# Patient Record
Sex: Female | Born: 1990 | Race: Black or African American | Hispanic: No | Smoking: Former smoker
Health system: Southern US, Community
[De-identification: ages and names within clinical notes are randomized; demographics above are authoritative.]

## PROBLEM LIST (undated history)

## (undated) ENCOUNTER — Inpatient Hospital Stay (HOSPITAL_COMMUNITY): Payer: Self-pay

## (undated) DIAGNOSIS — N390 Urinary tract infection, site not specified: Secondary | ICD-10-CM

## (undated) DIAGNOSIS — R87629 Unspecified abnormal cytological findings in specimens from vagina: Secondary | ICD-10-CM

## (undated) DIAGNOSIS — R51 Headache: Secondary | ICD-10-CM

---
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
## 2009-02-11 ENCOUNTER — Other Ambulatory Visit: Admission: RE | Admit: 2009-02-11 | Discharge: 2009-02-11 | Payer: Self-pay | Admitting: Family Medicine

## 2009-03-02 ENCOUNTER — Emergency Department (HOSPITAL_COMMUNITY): Admission: EM | Admit: 2009-03-02 | Discharge: 2009-03-02 | Payer: Self-pay | Admitting: Emergency Medicine

## 2009-10-17 ENCOUNTER — Emergency Department (HOSPITAL_COMMUNITY): Admission: EM | Admit: 2009-10-17 | Discharge: 2009-10-17 | Payer: Self-pay | Admitting: Emergency Medicine

## 2010-02-07 ENCOUNTER — Emergency Department (HOSPITAL_COMMUNITY)
Admission: EM | Admit: 2010-02-07 | Discharge: 2010-02-07 | Disposition: A | Discharge status: Other Acute Inpatient Hospital | Payer: Self-pay | Source: Home / Self Care | Admitting: Family Medicine

## 2010-02-07 ENCOUNTER — Emergency Department (HOSPITAL_COMMUNITY)
Admission: EM | Admit: 2010-02-07 | Discharge: 2010-02-07 | Payer: Self-pay | Source: Home / Self Care | Admitting: Emergency Medicine

## 2010-04-21 LAB — COMPREHENSIVE METABOLIC PANEL
ALT: 20 U/L (ref 0–35)
AST: 21 U/L (ref 0–37)
Albumin: 3.6 g/dL (ref 3.5–5.2)
Alkaline Phosphatase: 73 U/L (ref 39–117)
BUN: 4 mg/dL — ABNORMAL LOW (ref 6–23)
CO2: 23 mEq/L (ref 19–32)
Calcium: 9 mg/dL (ref 8.4–10.5)
Chloride: 108 mEq/L (ref 96–112)
Creatinine, Ser: 0.8 mg/dL (ref 0.4–1.2)
GFR calc Af Amer: >60 mL/min (ref >60)
GFR calc non Af Amer: >60 mL/min (ref >60)
Glucose, Bld: 95 mg/dL (ref 70–99)
Potassium: 3.8 mEq/L (ref 3.5–5.1)
Sodium: 137 mEq/L (ref 135–145)
Total Bilirubin: 0.2 mg/dL — ABNORMAL LOW (ref 0.3–1.2)
Total Protein: 7.1 g/dL (ref 6.0–8.3)

## 2010-04-21 LAB — POCT URINALYSIS DIPSTICK
Glucose, UA: NEGATIVE mg/dL
Hgb urine dipstick: NEGATIVE
Ketones, ur: NEGATIVE mg/dL
Nitrite: NEGATIVE
Protein, ur: 30 mg/dL — AB
Specific Gravity, Urine: >=1.03 (ref 1.005–1.030)
Urobilinogen, UA: 0.2 mg/dL (ref 0.0–1.0)
pH: 5.5 (ref 5.0–8.0)

## 2010-04-21 LAB — WET PREP, GENITAL
Trich, Wet Prep: NONE SEEN
Yeast Wet Prep HPF POC: NONE SEEN

## 2010-04-21 LAB — CBC
HCT: 38.8 % (ref 36.0–46.0)
Hemoglobin: 12.8 g/dL (ref 12.0–15.0)
MCH: 29.7 pg (ref 26.0–34.0)
MCHC: 33 g/dL (ref 30.0–36.0)
MCV: 90 fL (ref 78.0–100.0)
Platelets: 277 10*3/uL (ref 150–400)
RBC: 4.31 MIL/uL (ref 3.87–5.11)
RDW: 12.7 % (ref 11.5–15.5)
WBC: 7.6 10*3/uL (ref 4.0–10.5)

## 2010-04-21 LAB — RPR: RPR Ser Ql: NONREACTIVE

## 2010-04-21 LAB — POCT PREGNANCY, URINE: Preg Test, Ur: NEGATIVE

## 2010-04-21 LAB — GC/CHLAMYDIA PROBE AMP, GENITAL
Chlamydia, DNA Probe: NEGATIVE
GC Probe Amp, Genital: NEGATIVE

## 2010-04-21 LAB — LIPASE, BLOOD: Lipase: 109 U/L — ABNORMAL HIGH (ref 11–59)

## 2010-04-24 LAB — POCT URINALYSIS DIPSTICK
Bilirubin Urine: NEGATIVE
Glucose, UA: NEGATIVE mg/dL
Ketones, ur: NEGATIVE mg/dL
Nitrite: NEGATIVE
Protein, ur: 30 mg/dL — AB
Specific Gravity, Urine: 1.01 (ref 1.005–1.030)
Urobilinogen, UA: 0.2 mg/dL (ref 0.0–1.0)
pH: 7.5 (ref 5.0–8.0)

## 2010-04-24 LAB — URINE CULTURE
Colony Count: 55000
Culture  Setup Time: 201109081751

## 2010-04-24 LAB — POCT PREGNANCY, URINE: Preg Test, Ur: NEGATIVE

## 2010-05-29 ENCOUNTER — Emergency Department (HOSPITAL_COMMUNITY): Payer: 59

## 2010-05-29 ENCOUNTER — Emergency Department (HOSPITAL_COMMUNITY)
Admission: EM | Admit: 2010-05-29 | Discharge: 2010-05-29 | Disposition: A | Discharge status: Home / Self Care | Payer: 59 | Attending: Emergency Medicine | Admitting: Emergency Medicine

## 2010-05-29 DIAGNOSIS — F17200 Nicotine dependence, unspecified, uncomplicated: Secondary | ICD-10-CM | POA: Insufficient documentation

## 2010-05-29 DIAGNOSIS — IMO0001 Reserved for inherently not codable concepts without codable children: Secondary | ICD-10-CM | POA: Insufficient documentation

## 2010-05-29 DIAGNOSIS — J3489 Other specified disorders of nose and nasal sinuses: Secondary | ICD-10-CM | POA: Insufficient documentation

## 2010-05-29 DIAGNOSIS — J040 Acute laryngitis: Secondary | ICD-10-CM | POA: Insufficient documentation

## 2010-05-29 DIAGNOSIS — R059 Cough, unspecified: Secondary | ICD-10-CM | POA: Insufficient documentation

## 2010-05-29 DIAGNOSIS — R6883 Chills (without fever): Secondary | ICD-10-CM | POA: Insufficient documentation

## 2010-05-29 DIAGNOSIS — J40 Bronchitis, not specified as acute or chronic: Secondary | ICD-10-CM | POA: Insufficient documentation

## 2010-05-29 DIAGNOSIS — R05 Cough: Secondary | ICD-10-CM | POA: Insufficient documentation

## 2010-05-29 DIAGNOSIS — J029 Acute pharyngitis, unspecified: Secondary | ICD-10-CM | POA: Insufficient documentation

## 2010-05-29 LAB — RAPID STREP SCREEN (MED CTR MEBANE ONLY): Streptococcus, Group A Screen (Direct): NEGATIVE

## 2010-07-28 ENCOUNTER — Emergency Department (HOSPITAL_COMMUNITY)
Admission: EM | Admit: 2010-07-28 | Discharge: 2010-07-28 | Disposition: A | Discharge status: Home / Self Care | Payer: 59 | Attending: Emergency Medicine | Admitting: Emergency Medicine

## 2010-07-28 DIAGNOSIS — R300 Dysuria: Secondary | ICD-10-CM | POA: Insufficient documentation

## 2010-07-28 DIAGNOSIS — N12 Tubulo-interstitial nephritis, not specified as acute or chronic: Secondary | ICD-10-CM | POA: Insufficient documentation

## 2010-07-28 DIAGNOSIS — N898 Other specified noninflammatory disorders of vagina: Secondary | ICD-10-CM | POA: Insufficient documentation

## 2010-07-28 DIAGNOSIS — R1110 Vomiting, unspecified: Secondary | ICD-10-CM | POA: Insufficient documentation

## 2010-07-28 DIAGNOSIS — R197 Diarrhea, unspecified: Secondary | ICD-10-CM | POA: Insufficient documentation

## 2010-07-28 DIAGNOSIS — R350 Frequency of micturition: Secondary | ICD-10-CM | POA: Insufficient documentation

## 2010-07-28 DIAGNOSIS — R109 Unspecified abdominal pain: Secondary | ICD-10-CM | POA: Insufficient documentation

## 2010-07-28 LAB — URINALYSIS, ROUTINE W REFLEX MICROSCOPIC
Bilirubin Urine: NEGATIVE
Glucose, UA: NEGATIVE mg/dL
Hgb urine dipstick: NEGATIVE
Ketones, ur: NEGATIVE mg/dL
Nitrite: NEGATIVE
Protein, ur: NEGATIVE mg/dL
Specific Gravity, Urine: 1.014 (ref 1.005–1.030)
Urobilinogen, UA: 0.2 mg/dL (ref 0.0–1.0)
pH: 6.5 (ref 5.0–8.0)

## 2010-07-28 LAB — DIFFERENTIAL
Basophils Absolute: 0 10*3/uL (ref 0.0–0.1)
Basophils Relative: 0 % (ref 0–1)
Eosinophils Absolute: 0.2 10*3/uL (ref 0.0–0.7)
Eosinophils Relative: 3 % (ref 0–5)
Lymphocytes Relative: 20 % (ref 12–46)
Lymphs Abs: 1.4 10*3/uL (ref 0.7–4.0)
Monocytes Absolute: 0.6 10*3/uL (ref 0.1–1.0)
Monocytes Relative: 8 % (ref 3–12)
Neutro Abs: 4.8 10*3/uL (ref 1.7–7.7)
Neutrophils Relative %: 68 % (ref 43–77)

## 2010-07-28 LAB — CBC
HCT: 37.8 % (ref 36.0–46.0)
Hemoglobin: 13.2 g/dL (ref 12.0–15.0)
MCH: 30.8 pg (ref 26.0–34.0)
MCHC: 34.9 g/dL (ref 30.0–36.0)
MCV: 88.3 fL (ref 78.0–100.0)
Platelets: 297 10*3/uL (ref 150–400)
RBC: 4.28 MIL/uL (ref 3.87–5.11)
RDW: 12.3 % (ref 11.5–15.5)
WBC: 7.1 10*3/uL (ref 4.0–10.5)

## 2010-07-28 LAB — COMPREHENSIVE METABOLIC PANEL
ALT: 15 U/L (ref 0–35)
AST: 15 U/L (ref 0–37)
Albumin: 3.4 g/dL — ABNORMAL LOW (ref 3.5–5.2)
Alkaline Phosphatase: 71 U/L (ref 39–117)
BUN: 5 mg/dL — ABNORMAL LOW (ref 6–23)
CO2: 27 mEq/L (ref 19–32)
Calcium: 9.3 mg/dL (ref 8.4–10.5)
Chloride: 99 mEq/L (ref 96–112)
Creatinine, Ser: 0.84 mg/dL (ref 0.50–1.10)
GFR calc Af Amer: >60 mL/min (ref >60)
GFR calc non Af Amer: >60 mL/min (ref >60)
Glucose, Bld: 90 mg/dL (ref 70–99)
Potassium: 3.6 mEq/L (ref 3.5–5.1)
Sodium: 135 mEq/L (ref 135–145)
Total Bilirubin: 0.3 mg/dL (ref 0.3–1.2)
Total Protein: 6.9 g/dL (ref 6.0–8.3)

## 2010-07-28 LAB — URINE MICROSCOPIC-ADD ON

## 2010-07-28 LAB — WET PREP, GENITAL
Trich, Wet Prep: NONE SEEN
Yeast Wet Prep HPF POC: NONE SEEN

## 2010-07-28 LAB — LIPASE, BLOOD: Lipase: 16 U/L (ref 11–59)

## 2010-07-28 LAB — POCT PREGNANCY, URINE: Preg Test, Ur: NEGATIVE

## 2010-07-30 LAB — URINE CULTURE
Colony Count: >=100000
Culture  Setup Time: 201206182050

## 2010-07-30 LAB — GC/CHLAMYDIA PROBE AMP, GENITAL
Chlamydia, DNA Probe: NEGATIVE
GC Probe Amp, Genital: NEGATIVE

## 2010-10-17 ENCOUNTER — Emergency Department (HOSPITAL_COMMUNITY): Payer: 59

## 2010-10-17 ENCOUNTER — Emergency Department (HOSPITAL_COMMUNITY)
Admission: EM | Admit: 2010-10-17 | Discharge: 2010-10-17 | Disposition: A | Discharge status: Home / Self Care | Payer: 59 | Attending: Emergency Medicine | Admitting: Emergency Medicine

## 2010-10-17 DIAGNOSIS — R109 Unspecified abdominal pain: Secondary | ICD-10-CM | POA: Insufficient documentation

## 2010-10-17 DIAGNOSIS — F17200 Nicotine dependence, unspecified, uncomplicated: Secondary | ICD-10-CM | POA: Insufficient documentation

## 2010-10-17 DIAGNOSIS — R10819 Abdominal tenderness, unspecified site: Secondary | ICD-10-CM | POA: Insufficient documentation

## 2010-10-17 DIAGNOSIS — O99891 Other specified diseases and conditions complicating pregnancy: Secondary | ICD-10-CM | POA: Insufficient documentation

## 2010-10-17 LAB — URINALYSIS, ROUTINE W REFLEX MICROSCOPIC
Bilirubin Urine: NEGATIVE
Glucose, UA: NEGATIVE mg/dL
Hgb urine dipstick: NEGATIVE
Ketones, ur: 15 mg/dL — AB
Leukocytes, UA: NEGATIVE
Nitrite: NEGATIVE
Protein, ur: NEGATIVE mg/dL
Specific Gravity, Urine: 1.013 (ref 1.005–1.030)
Urobilinogen, UA: 0.2 mg/dL (ref 0.0–1.0)
pH: 8.5 — ABNORMAL HIGH (ref 5.0–8.0)

## 2010-10-17 LAB — POCT I-STAT, CHEM 8
BUN: <3 mg/dL — ABNORMAL LOW (ref 6–23)
Calcium, Ion: 1.28 mmol/L (ref 1.12–1.32)
Chloride: 103 mEq/L (ref 96–112)
Creatinine, Ser: 0.7 mg/dL (ref 0.50–1.10)
Glucose, Bld: 81 mg/dL (ref 70–99)
HCT: 36 % (ref 36.0–46.0)
Hemoglobin: 12.2 g/dL (ref 12.0–15.0)
Potassium: 3.5 mEq/L (ref 3.5–5.1)
Sodium: 138 mEq/L (ref 135–145)
TCO2: 22 mmol/L (ref 0–100)

## 2010-10-17 LAB — DIFFERENTIAL
Basophils Absolute: 0 10*3/uL (ref 0.0–0.1)
Basophils Relative: 0 % (ref 0–1)
Eosinophils Absolute: 0.3 10*3/uL (ref 0.0–0.7)
Eosinophils Relative: 5 % (ref 0–5)
Lymphocytes Relative: 36 % (ref 12–46)
Lymphs Abs: 2.3 10*3/uL (ref 0.7–4.0)
Monocytes Absolute: 0.4 10*3/uL (ref 0.1–1.0)
Monocytes Relative: 6 % (ref 3–12)
Neutro Abs: 3.4 10*3/uL (ref 1.7–7.7)
Neutrophils Relative %: 54 % (ref 43–77)

## 2010-10-17 LAB — RETICULOCYTES
RBC.: 4.04 MIL/uL (ref 3.87–5.11)
Retic Count, Absolute: 48.5 10*3/uL (ref 19.0–186.0)
Retic Ct Pct: 1.2 % (ref 0.4–3.1)

## 2010-10-17 LAB — CBC
HCT: 35 % — ABNORMAL LOW (ref 36.0–46.0)
Hemoglobin: 12.5 g/dL (ref 12.0–15.0)
MCH: 30.9 pg (ref 26.0–34.0)
MCHC: 35.7 g/dL (ref 30.0–36.0)
MCV: 86.6 fL (ref 78.0–100.0)
Platelets: 259 10*3/uL (ref 150–400)
RBC: 4.04 MIL/uL (ref 3.87–5.11)
RDW: 12.7 % (ref 11.5–15.5)
WBC: 6.4 10*3/uL (ref 4.0–10.5)

## 2010-10-17 LAB — ABO/RH: ABO/RH(D): B POS

## 2010-10-17 LAB — POCT PREGNANCY, URINE: Preg Test, Ur: POSITIVE

## 2010-10-17 LAB — HCG, QUANTITATIVE, PREGNANCY: hCG, Beta Chain, Quant, S: 38462 m[IU]/mL — ABNORMAL HIGH (ref <5)

## 2010-10-18 LAB — GC/CHLAMYDIA PROBE AMP, GENITAL
Chlamydia, DNA Probe: NEGATIVE
GC Probe Amp, Genital: NEGATIVE

## 2010-11-02 ENCOUNTER — Encounter (HOSPITAL_COMMUNITY): Payer: Self-pay

## 2010-11-02 ENCOUNTER — Inpatient Hospital Stay (HOSPITAL_COMMUNITY)
Admission: AD | Admit: 2010-11-02 | Discharge: 2010-11-02 | Disposition: A | Discharge status: Home / Self Care | Payer: 59 | Source: Ambulatory Visit | Attending: Obstetrics and Gynecology | Admitting: Obstetrics and Gynecology

## 2010-11-02 DIAGNOSIS — O99891 Other specified diseases and conditions complicating pregnancy: Secondary | ICD-10-CM | POA: Insufficient documentation

## 2010-11-02 DIAGNOSIS — J069 Acute upper respiratory infection, unspecified: Secondary | ICD-10-CM | POA: Insufficient documentation

## 2010-11-02 DIAGNOSIS — J40 Bronchitis, not specified as acute or chronic: Secondary | ICD-10-CM | POA: Insufficient documentation

## 2010-11-02 DIAGNOSIS — J209 Acute bronchitis, unspecified: Secondary | ICD-10-CM

## 2010-11-02 LAB — URINALYSIS, ROUTINE W REFLEX MICROSCOPIC
Bilirubin Urine: NEGATIVE
Glucose, UA: NEGATIVE mg/dL
Hgb urine dipstick: NEGATIVE
Ketones, ur: NEGATIVE mg/dL
Leukocytes, UA: NEGATIVE
Nitrite: NEGATIVE
Protein, ur: NEGATIVE mg/dL
Specific Gravity, Urine: 1.015 (ref 1.005–1.030)
Urobilinogen, UA: 0.2 mg/dL (ref 0.0–1.0)
pH: 7 (ref 5.0–8.0)

## 2010-11-02 MED ORDER — AZITHROMYCIN 1 G PO PACK: 1.0000 | PACK | Freq: Once | ORAL | Status: AC

## 2010-11-02 MED ORDER — GUAIFENESIN-DM 100-10 MG/5ML PO SYRP: 5.0000 mL | ORAL_SOLUTION | Freq: Three times a day (TID) | ORAL | Status: DC | PRN

## 2010-11-02 MED ORDER — GUAIFENESIN-CODEINE 100-10 MG/5ML PO SYRP: 5.0000 mL | ORAL_SOLUTION | Freq: Three times a day (TID) | ORAL | Status: AC | PRN

## 2010-11-02 NOTE — Progress Notes (Signed)
Pt states she has had a cough and nasal congestion for 3 days-states she also has chest pains in the under her breast-states she did not have this until today-feels like it is related to coughing so much

## 2010-11-02 NOTE — ED Provider Notes (Signed)
Carloyn Manner [id number] Chief Complaint  Patient presents with  . Chest Pain    SUBJECTIVE  HPI: 3 d hx of UR congestion and progressive cough, at times productive of mucus. Today having substernal CP with coughing and with deep inspiration. Denies wheezing, SOB, fever/chills. Denies allergies or smoking exposure.Had been on inhaler years ago.  Has urinary frequency, no dysuria.   No past medical history on file. No past surgical history on file. History   Social History  . Marital Status: Single    Spouse Name: N/A    Number of Children: N/A  . Years of Education: N/A   Occupational History  . Not on file.   Social History Main Topics  . Smoking status: Not on file  . Smokeless tobacco: Not on file  . Alcohol Use: Not on file  . Drug Use: Not on file  . Sexually Active: Not on file   Other Topics Concern  . Not on file   Social History Narrative  . No narrative on file   No current facility-administered medications on file prior to encounter.   No current outpatient prescriptions on file prior to encounter.   No Known Allergies  ROS: pertinent items in HPI  OBJECTIVE  BP 116/65  Pulse 88  Temp(Src) 98.7 F (37.1 C) (Oral)  Resp 18  Ht 5\' 7"  (1.702 m)  Wt 91.354 kg (201 lb 6.4 oz)  BMI 31.54 kg/m2  FHR DT 155  Physical Exam:  General: WN/WD in NAD Chest wall: nontender to palpation or compression Lungs: CTA bilat Cor: RRR w/o m Abd: soft, NT, S=D  Results for orders placed during the hospital encounter of 11/02/10 (from the past 24 hour(s))  URINALYSIS, ROUTINE W REFLEX MICROSCOPIC     Status: Normal   Collection Time   11/02/10  7:20 PM      Component Value Range   Color, Urine YELLOW  YELLOW    Appearance CLEAR  CLEAR    Specific Gravity, Urine 1.015  1.005 - 1.030    pH 7.0  5.0 - 8.0    Glucose, UA NEGATIVE  NEGATIVE (mg/dL)   Hgb urine dipstick NEGATIVE  NEGATIVE    Bilirubin Urine NEGATIVE  NEGATIVE    Ketones, ur NEGATIVE   NEGATIVE (mg/dL)   Protein, ur NEGATIVE  NEGATIVE (mg/dL)   Urobilinogen, UA 0.2  0.0 - 1.0 (mg/dL)   Nitrite NEGATIVE  NEGATIVE    Leukocytes, UA NEGATIVE  NEGATIVE     ASSESSMENT  URI/bronchitis IUP 14.4 wks   PLAN C/w Dr. Henderson Cloud: Z-pak and Tussinex Rx. Call office if fever, worsening sx. General measures reviewed

## 2010-11-02 NOTE — Progress Notes (Signed)
Patient reports has a cold with chest pains, inhaler has expired, onset 3 days ago.

## 2011-03-29 ENCOUNTER — Encounter (HOSPITAL_COMMUNITY): Payer: Self-pay | Admitting: *Deleted

## 2011-03-29 ENCOUNTER — Inpatient Hospital Stay (HOSPITAL_COMMUNITY)
Admission: AD | Admit: 2011-03-29 | Discharge: 2011-03-30 | Disposition: A | Discharge status: Home / Self Care | Payer: 59 | Source: Ambulatory Visit | Attending: Obstetrics & Gynecology | Admitting: Obstetrics & Gynecology

## 2011-03-29 DIAGNOSIS — R109 Unspecified abdominal pain: Secondary | ICD-10-CM | POA: Insufficient documentation

## 2011-03-29 DIAGNOSIS — O99891 Other specified diseases and conditions complicating pregnancy: Secondary | ICD-10-CM | POA: Insufficient documentation

## 2011-03-29 DIAGNOSIS — M6208 Separation of muscle (nontraumatic), other site: Secondary | ICD-10-CM

## 2011-03-29 DIAGNOSIS — M6200 Separation of muscle (nontraumatic), unspecified site: Secondary | ICD-10-CM | POA: Insufficient documentation

## 2011-03-29 HISTORY — DX: Headache: R51

## 2011-03-29 NOTE — Progress Notes (Signed)
Pt c/o of burining across her lower abd and also at the belly button-also c/o of feeling SOB

## 2011-03-29 NOTE — Progress Notes (Signed)
Pt reports a "burning sensation just below" umbilicus since about 11:00 am. Pt also states that she has had difficulty breathing since Friday. It hurts under her L breast when she take a deep breath. Pt also reports that breathing is more difficult when lying down.

## 2011-03-30 ENCOUNTER — Encounter (HOSPITAL_COMMUNITY): Payer: Self-pay | Admitting: *Deleted

## 2011-03-30 MED ORDER — ACETAMINOPHEN 325 MG PO TABS
650.0000 mg | ORAL_TABLET | Freq: Once | ORAL | Status: AC
  Administered 2011-03-30: 650 mg via ORAL
  Filled 2011-03-30: qty 2

## 2011-03-30 NOTE — Discharge Instructions (Signed)
* Return to your provider in [place] (or [facility] hospital, if still in town) if you think you are in labor, your water breaks, you have vaginal bleeding, your baby isn't moving like normal, if your symptoms get worse, or for any other concerns.*  Pregnancy - Third Trimester The third trimester of pregnancy (the last 3 months) is a period of the most rapid growth for you and your baby. The baby approaches a length of 20 inches and a weight of 6 to 10 pounds. The baby is adding on fat and getting ready for life outside your body. While inside, babies have periods of sleeping and waking, suck their thumbs, and hiccups. You can often feel small contractions of the uterus. This is false labor. It is also called Braxton-Hicks contractions. This is like a practice for labor. The usual problems in this stage of pregnancy include more difficulty breathing, swelling of the hands and feet from water retention, and having to urinate more often because of the uterus and baby pressing on your bladder.  PRENATAL EXAMS  Blood work may continue to be done during prenatal exams. These tests are done to check on your health and the probable health of your baby. Blood work is used to follow your blood levels (hemoglobin). Anemia (low hemoglobin) is common during pregnancy. Iron and vitamins are given to help prevent this. You may also continue to be checked for diabetes. Some of the past blood tests may be done again.   The size of the uterus is measured during each visit. This makes sure your baby is growing properly according to your pregnancy dates.   Your blood pressure is checked every prenatal visit. This is to make sure you are not getting toxemia.   Your urine is checked every prenatal visit for infection, diabetes and protein.   Your weight is checked at each visit. This is done to make sure gains are happening at the suggested rate and that you and your baby are growing normally.   Sometimes, an ultrasound is  performed to confirm the position and the proper growth and development of the baby. This is a test done that bounces harmless sound waves off the baby so your caregiver can more accurately determine due dates.   Discuss the type of pain medication and anesthesia you will have during your labor and delivery.   Discuss the possibility and anesthesia if a Cesarean Section might be necessary.   Inform your caregiver if there is any mental or physical violence at home.  Sometimes, a specialized non-stress test, contraction stress test and biophysical profile are done to make sure the baby is not having a problem. Checking the amniotic fluid surrounding the baby is called an amniocentesis. The amniotic fluid is removed by sticking a needle into the belly (abdomen). This is sometimes done near the end of pregnancy if an early delivery is required. In this case, it is done to help make sure the baby's lungs are mature enough for the baby to live outside of the womb. If the lungs are not mature and it is unsafe to deliver the baby, an injection of cortisone medication is given to the mother 1 to 2 days before the delivery. This helps the baby's lungs mature and makes it safer to deliver the baby. CHANGES OCCURING IN THE THIRD TRIMESTER OF PREGNANCY Your body goes through many changes during pregnancy. They vary from person to person. Talk to your caregiver about changes you notice and are concerned about.  During the last trimester, you have probably had an increase in your appetite. It is normal to have cravings for certain foods. This varies from person to person and pregnancy to pregnancy.   You may begin to get stretch marks on your hips, abdomen, and breasts. These are normal changes in the body during pregnancy. There are no exercises or medications to take which prevent this change.   Constipation may be treated with a stool softener or adding bulk to your diet. Drinking lots of fluids, fiber in  vegetables, fruits, and whole grains are helpful.   Exercising is also helpful. If you have been very active up until your pregnancy, most of these activities can be continued during your pregnancy. If you have been less active, it is helpful to start an exercise program such as walking. Consult your caregiver before starting exercise programs.   Avoid all smoking, alcohol, un-prescribed drugs, herbs and "street drugs" during your pregnancy. These chemicals affect the formation and growth of the baby. Avoid chemicals throughout the pregnancy to ensure the delivery of a healthy infant.   Backache, varicose veins and hemorrhoids may develop or get worse.   You will tire more easily in the third trimester, which is normal.   The baby's movements may be stronger and more often.   You may become short of breath easily.   Your belly button may stick out.   A yellow discharge may leak from your breasts called colostrum.   You may have a bloody mucus discharge. This usually occurs a few days to a week before labor begins.  HOME CARE INSTRUCTIONS   Keep your caregiver's appointments. Follow your caregiver's instructions regarding medication use, exercise, and diet.   During pregnancy, you are providing food for you and your baby. Continue to eat regular, well-balanced meals. Choose foods such as meat, fish, milk and other low fat dairy products, vegetables, fruits, and whole-grain breads and cereals. Your caregiver will tell you of the ideal weight gain.   A physical sexual relationship may be continued throughout pregnancy if there are no other problems such as early (premature) leaking of amniotic fluid from the membranes, vaginal bleeding, or belly (abdominal) pain.   Exercise regularly if there are no restrictions. Check with your caregiver if you are unsure of the safety of your exercises. Greater weight gain will occur in the last 2 trimesters of pregnancy. Exercising helps:   Control your  weight.   Get you in shape for labor and delivery.   You lose weight after you deliver.   Rest a lot with legs elevated, or as needed for leg cramps or low back pain.   Wear a good support or jogging bra for breast tenderness during pregnancy. This may help if worn during sleep. Pads or tissues may be used in the bra if you are leaking colostrum.   Do not use hot tubs, steam rooms, or saunas.   Wear your seat belt when driving. This protects you and your baby if you are in an accident.   Avoid raw meat, cat litter boxes and soil used by cats. These carry germs that can cause birth defects in the baby.   It is easier to loose urine during pregnancy. Tightening up and strengthening the pelvic muscles will help with this problem. You can practice stopping your urination while you are going to the bathroom. These are the same muscles you need to strengthen. It is also the muscles you would use if you were  trying to stop from passing gas. You can practice tightening these muscles up 10 times a set and repeating this about 3 times per day. Once you know what muscles to tighten up, do not perform these exercises during urination. It is more likely to cause an infection by backing up the urine.   Ask for help if you have financial, counseling or nutritional needs during pregnancy. Your caregiver will be able to offer counseling for these needs as well as refer you for other special needs.   Make a list of emergency phone numbers and have them available.   Plan on getting help from family or friends when you go home from the hospital.   Make a trial run to the hospital.   Take prenatal classes with the father to understand, practice and ask questions about the labor and delivery.   Prepare the baby's room/nursery.   Do not travel out of the city unless it is absolutely necessary and with the advice of your caregiver.   Wear only low or no heal shoes to have better balance and prevent falling.    MEDICATIONS AND DRUG USE IN PREGNANCY  Take prenatal vitamins as directed. The vitamin should contain 1 milligram of folic acid. Keep all vitamins out of reach of children. Only a couple vitamins or tablets containing iron may be fatal to a baby or young child when ingested.   Avoid use of all medications, including herbs, over-the-counter medications, not prescribed or suggested by your caregiver. Only take over-the-counter or prescription medicines for pain, discomfort, or fever as directed by your caregiver. Do not use aspirin, ibuprofen (Motrin, Advil, Nuprin) or naproxen (Aleve) unless OK'd by your caregiver.   Let your caregiver also know about herbs you may be using.   Alcohol is related to a number of birth defects. This includes fetal alcohol syndrome. All alcohol, in any form, should be avoided completely. Smoking will cause low birth rate and premature babies.   Street/illegal drugs are very harmful to the baby. They are absolutely forbidden. A baby born to an addicted mother will be addicted at birth. The baby will go through the same withdrawal an adult does.  SEEK MEDICAL CARE IF: You have any concerns or worries during your pregnancy. It is better to call with your questions if you feel they cannot wait, rather than worry about them. DECISIONS ABOUT CIRCUMCISION You may or may not know the sex of your baby. If you know your baby is a boy, it may be time to think about circumcision. Circumcision is the removal of the foreskin of the penis. This is the skin that covers the sensitive end of the penis. There is no proven medical need for this. Often this decision is made on what is popular at the time or based upon religious beliefs and social issues. You can discuss these issues with your caregiver or pediatrician. SEEK IMMEDIATE MEDICAL CARE IF:   An unexplained oral temperature above 102 F (38.9 C) develops, or as your caregiver suggests.   You have leaking of fluid from the  vagina (birth canal). If leaking membranes are suspected, take your temperature and tell your caregiver of this when you call.   There is vaginal spotting, bleeding or passing clots. Tell your caregiver of the amount and how many pads are used.   You develop a bad smelling vaginal discharge with a change in the color from clear to white.   You develop vomiting that lasts more than 24  hours.   You develop chills or fever.   You develop shortness of breath.   You develop burning on urination.   You loose more than 2 pounds of weight or gain more than 2 pounds of weight or as suggested by your caregiver.   You notice sudden swelling of your face, hands, and feet or legs.   You develop belly (abdominal) pain. Round ligament discomfort is a common non-cancerous (benign) cause of abdominal pain in pregnancy. Your caregiver still must evaluate you.   You develop a severe headache that does not go away.   You develop visual problems, blurred or double vision.   If you have not felt your baby move for more than 1 hour. If you think the baby is not moving as much as usual, eat something with sugar in it and lie down on your left side for an hour. The baby should move at least 4 to 5 times per hour. Call right away if your baby moves less than that.   You fall, are in a car accident or any kind of trauma.   There is mental or physical violence at home.  Document Released: 01/20/2001 Document Revised: 10/08/2010 Document Reviewed: 07/25/2008 [facility] Patient Information 2012 [place], [place].  Fetal Movement Counts Patient Name: __________________________________________________ Patient Due Date: ____________________ Annette Conway counts is highly recommended in high risk pregnancies, but it is a good idea for every pregnant woman to do. Start counting fetal movements at 28 weeks of the pregnancy. Fetal movements increase after eating a full meal or eating or drinking something sweet (the blood sugar is  higher). It is also important to drink plenty of fluids (well hydrated) before doing the count. Lie on your left side because it helps with the circulation or you can sit in a comfortable chair with your arms over your belly (abdomen) with no distractions around you. DOING THE COUNT  Try to do the count the same time of day each time you do it.   Mark the day and time, then see how long it takes for you to feel 10 movements (kicks, flutters, swishes, rolls). You should have at least 10 movements within 2 hours. You will most likely feel 10 movements in much less than 2 hours. If you do not, wait an hour and count again. After a couple of days you will see a pattern.   What you are looking for is a change in the pattern or not enough counts in 2 hours. Is it taking longer in time to reach 10 movements?  SEEK MEDICAL CARE IF:  You feel less than 10 counts in 2 hours. Tried twice.   No movement in one hour.   The pattern is changing or taking longer each day to reach 10 counts in 2 hours.   You feel the baby is not moving as it usually does.  Date: ____________ Movements: ____________ Start time: ____________ Annette Conway time: ____________  Date: ____________ Movements: ____________ Start time: ____________ Annette Conway time: ____________ Date: ____________ Movements: ____________ Start time: ____________ Annette Conway time: ____________ Date: ____________ Movements: ____________ Start time: ____________ Annette Conway time: ____________ Date: ____________ Movements: ____________ Start time: ____________ Annette Conway time: ____________ Date: ____________ Movements: ____________ Start time: ____________ Annette Conway time: ____________ Date: ____________ Movements: ____________ Start time: ____________ Annette Conway time: ____________ Date: ____________ Movements: ____________ Start time: ____________ Annette Conway time: ____________  Date: ____________ Movements: ____________ Start time: ____________ Annette Conway time: ____________ Date: ____________  Movements: ____________ Start time: ____________ Annette Conway time: ____________ Date: ____________ Movements: ____________  Start time: ____________ Annette Conway time: ____________ Date: ____________ Movements: ____________ Start time: ____________ Annette Conway time: ____________ Date: ____________ Movements: ____________ Start time: ____________ Annette Conway time: ____________ Date: ____________ Movements: ____________ Start time: ____________ Annette Conway time: ____________ Date: ____________ Movements: ____________ Start time: ____________ Annette Conway time: ____________  Date: ____________ Movements: ____________ Start time: ____________ Annette Conway time: ____________ Date: ____________ Movements: ____________ Start time: ____________ Annette Conway time: ____________ Date: ____________ Movements: ____________ Start time: ____________ Annette Conway time: ____________ Date: ____________ Movements: ____________ Start time: ____________ Annette Conway time: ____________ Date: ____________ Movements: ____________ Start time: ____________ Annette Conway time: ____________ Date: ____________ Movements: ____________ Start time: ____________ Annette Conway time: ____________ Date: ____________ Movements: ____________ Start time: ____________ Annette Conway time: ____________  Date: ____________ Movements: ____________ Start time: ____________ Annette Conway time: ____________ Date: ____________ Movements: ____________ Start time: ____________ Annette Conway time: ____________ Date: ____________ Movements: ____________ Start time: ____________ Annette Conway time: ____________ Date: ____________ Movements: ____________ Start time: ____________ Annette Conway time: ____________ Date: ____________ Movements: ____________ Start time: ____________ Annette Conway time: ____________ Date: ____________ Movements: ____________ Start time: ____________ Annette Conway time: ____________ Date: ____________ Movements: ____________ Start time: ____________ Annette Conway time: ____________  Date: ____________ Movements: ____________ Start time:  ____________ Annette Conway time: ____________ Date: ____________ Movements: ____________ Start time: ____________ Annette Conway time: ____________ Date: ____________ Movements: ____________ Start time: ____________ Annette Conway time: ____________ Date: ____________ Movements: ____________ Start time: ____________ Annette Conway time: ____________ Date: ____________ Movements: ____________ Start time: ____________ Annette Conway time: ____________ Date: ____________ Movements: ____________ Start time: ____________ Annette Conway time: ____________ Date: ____________ Movements: ____________ Start time: ____________ Annette Conway time: ____________  Date: ____________ Movements: ____________ Start time: ____________ Annette Conway time: ____________ Date: ____________ Movements: ____________ Start time: ____________ Annette Conway time: ____________ Date: ____________ Movements: ____________ Start time: ____________ Annette Conway time: ____________ Date: ____________ Movements: ____________ Start time: ____________ Annette Conway time: ____________ Date: ____________ Movements: ____________ Start time: ____________ Annette Conway time: ____________ Date: ____________ Movements: ____________ Start time: ____________ Annette Conway time: ____________ Date: ____________ Movements: ____________ Start time: ____________ Annette Conway time: ____________  Date: ____________ Movements: ____________ Start time: ____________ Annette Conway time: ____________ Date: ____________ Movements: ____________ Start time: ____________ Annette Conway time: ____________ Date: ____________ Movements: ____________ Start time: ____________ Annette Conway time: ____________ Date: ____________ Movements: ____________ Start time: ____________ Annette Conway time: ____________ Date: ____________ Movements: ____________ Start time: ____________ Annette Conway time: ____________ Date: ____________ Movements: ____________ Start time: ____________ Annette Conway time: ____________ Date: ____________ Movements: ____________ Start time: ____________ Annette Conway time: ____________  Date:  ____________ Movements: ____________ Start time: ____________ Annette Conway time: ____________ Date: ____________ Movements: ____________ Start time: ____________ Annette Conway time: ____________ Date: ____________ Movements: ____________ Start time: ____________ Annette Conway time: ____________ Date: ____________ Movements: ____________ Start time: ____________ Annette Conway time: ____________ Date: ____________ Movements: ____________ Start time: ____________ Annette Conway time: ____________ Date: ____________ Movements: ____________ Start time: ____________ Annette Conway time: ____________ Document Released: 02/25/2006 Document Revised: 10/08/2010 Document Reviewed: 08/28/2008 [employer] Patient Information 2012 [employer].

## 2011-03-30 NOTE — ED Provider Notes (Signed)
History   Annette Conway is a 21 y.o. G1P0 at [redacted]w[redacted]d presenting with report of constant burning sensation below umbilicus that began @ approximately 1100 on 2/17, as well as pain under Lt breast upon inspiration with intermittent sob.  Denies recent URI/cough or chest pain.  Also headache today, hasn't taken anything at home to relieve.  H/O migraines.  Denies scotomata or RUQ pain.  Reports +fm.  Denies VB or LOF.  Receives pnc in [place], is here visiting until approximately Wed.  Pregnancy has been uncomplicated per pt, last visit was on Fri 2/15 and 'all was fine', next visit is on Fri 2/22.    Chief Complaint  Patient presents with  . Abdominal Pain   HPI  OB History    Grav Para Term Preterm Abortions TAB SAB Ect Mult Living   1               Past Medical History  Diagnosis Date  . No pertinent past medical history   . Headache     Past Surgical History  Procedure Date  . No past surgeries     Family History  Problem Relation Age of Onset  . Anesthesia problems Neg Hx   . Hypotension Neg Hx   . Malignant hyperthermia Neg Hx   . Pseudochol deficiency Neg Hx     History  Substance Use Topics  . Smoking status: Former Smoker -- 0.2 packs/day for 4 years    Types: Cigarettes    Quit date: 02/26/2011  . Smokeless tobacco: Not on file  . Alcohol Use: No    Allergies: No Known Allergies  Prescriptions prior to admission  Medication Sig Dispense Refill  . prenatal vitamin w/FE, FA (PRENATAL 1 + 1) 27-1 MG TABS Take 1 tablet by mouth daily.          Review of Systems  Constitutional: Negative.   Eyes: Negative for blurred vision, double vision and photophobia.  Respiratory: Negative for cough. Shortness of breath: occasional.   Cardiovascular: Negative.   Gastrointestinal: Negative for abdominal pain.       Constant 'burning' under umbilicus all day Last BM yesterday  Genitourinary: Negative.   Musculoskeletal: Negative.   Skin: Negative.   Neurological:  Negative.   Endo/Heme/Allergies: Negative.   Psychiatric/Behavioral: Negative.    Physical Exam   Blood pressure 102/57, pulse 92, temperature 97.9 F (36.6 C), temperature source Axillary, resp. rate 20, height 5\' 6"  (1.676 m), weight 108.636 kg (239 lb 8 oz), SpO2 99.00%.  Physical Exam  Constitutional: She is oriented to person, place, and time. She appears well-developed and well-nourished.  HENT:  Head: Normocephalic.  Eyes: Pupils are equal, round, and reactive to light.  Neck: Normal range of motion.  Cardiovascular: Normal rate and regular rhythm.   Respiratory: Effort normal and breath sounds normal. No respiratory distress.       O2 sats wnl, no current sob  GI: Soft.       Gravid  Genitourinary:       deferred  Musculoskeletal: Normal range of motion. She exhibits edema (1+ BLE edema).  Neurological: She is alert and oriented to person, place, and time. She has normal reflexes.       -clonus  Skin: Skin is warm and dry.  Psychiatric: She has a normal mood and affect. Her behavior is normal. Judgment and thought content normal.   FHR: 135 w/ moderate variability, 15x15accels, no decels=Cat I UCs: occasional, mild, not perceived by pt  MAU Course  Procedures  EFM Tylenol 650mg  po x 1  Assessment and Plan  A: IUP @ 35.5wks     FHR Cat I     Stable maternal/fetal unit     Abdominal diastasis     Possible gas related pain under Lt breast  P: D/C home     Maternity belt for abdominal discomfort     Tylenol prn for headache     Keep appt w/ provider in [place] Fri 2/22     Return to provider in [place] (or [facility] if still in town) for ptl, srom, vb, decreased fm, worsening of symptoms, or other concerns  Reviewed presenting complaints w/ Zerita Boers, CNM who states ok to d/c pt home  Joellyn Haff, SNM 03/30/2011, 12:58 AM

## 2011-06-12 ENCOUNTER — Encounter (HOSPITAL_COMMUNITY): Payer: Self-pay | Admitting: Emergency Medicine

## 2011-06-12 ENCOUNTER — Emergency Department (HOSPITAL_COMMUNITY)
Admission: EM | Admit: 2011-06-12 | Discharge: 2011-06-13 | Disposition: A | Discharge status: Home / Self Care | Payer: 59 | Attending: Emergency Medicine | Admitting: Emergency Medicine

## 2011-06-12 ENCOUNTER — Emergency Department (HOSPITAL_COMMUNITY): Payer: 59

## 2011-06-12 DIAGNOSIS — R079 Chest pain, unspecified: Secondary | ICD-10-CM | POA: Insufficient documentation

## 2011-06-12 DIAGNOSIS — R059 Cough, unspecified: Secondary | ICD-10-CM | POA: Insufficient documentation

## 2011-06-12 DIAGNOSIS — R0602 Shortness of breath: Secondary | ICD-10-CM | POA: Insufficient documentation

## 2011-06-12 DIAGNOSIS — F17200 Nicotine dependence, unspecified, uncomplicated: Secondary | ICD-10-CM | POA: Insufficient documentation

## 2011-06-12 DIAGNOSIS — R05 Cough: Secondary | ICD-10-CM | POA: Insufficient documentation

## 2011-06-12 MED ORDER — KETOROLAC TROMETHAMINE 60 MG/2ML IM SOLN
60.0000 mg | Freq: Once | INTRAMUSCULAR | Status: AC
  Administered 2011-06-12: 60 mg via INTRAMUSCULAR
  Filled 2011-06-12: qty 2

## 2011-06-12 MED ORDER — METHOCARBAMOL 500 MG PO TABS
500.0000 mg | ORAL_TABLET | Freq: Once | ORAL | Status: AC
  Administered 2011-06-12: 500 mg via ORAL
  Filled 2011-06-12: qty 1

## 2011-06-12 NOTE — ED Provider Notes (Signed)
History     CSN: [id number]  Arrival date & time 06/12/11  2014   First MD Initiated Contact with Patient 06/12/11 2246     11:35 PM HPI Patient reports for several days has had a right-sided chest pain. Describes pain as sharp. States pain is worse with deep breathing and coughing. Denies a significant cough. Reports now shortness of breath is becoming constant. States she went to urgent care and was sent here. Patient denies fever, back pain, sneezing, congestion. Denies history oral contraceptives, recent route, recent surgery, hypertension, diabetes, hyperlipidemia, family history for liver disease. Patient does report she is a smoker.  Patient is a 21 y.o. female presenting with chest pain. The history is provided by the patient.  Chest Pain Chest pain occurs constantly. The chest pain is worsening. The pain is associated with breathing. The severity of the pain is moderate. The quality of the pain is described as sharp. The pain does not radiate. Chest pain is worsened by deep breathing. Primary symptoms include shortness of breath and cough. Pertinent negatives for primary symptoms include no fever, no fatigue, no wheezing, no abdominal pain, no nausea, no vomiting and no dizziness.  Pertinent negatives for associated symptoms include no claudication, no diaphoresis, no lower extremity edema, no numbness, no orthopnea and no weakness.     Past Medical History  Diagnosis Date  . No pertinent past medical history   . Headache     Past Surgical History  Procedure Date  . No past surgeries   . Cesarean section     Family History  Problem Relation Age of Onset  . Anesthesia problems Neg Hx   . Hypotension Neg Hx   . Malignant hyperthermia Neg Hx   . Pseudochol deficiency Neg Hx     History  Substance Use Topics  . Smoking status: Current Everyday Smoker -- 0.2 packs/day for 4 years    Types: Cigarettes    Last Attempt to Quit: 02/26/2011  . Smokeless tobacco: Not on file    . Alcohol Use: No    OB History    Grav Para Term Preterm Abortions TAB SAB Ect Mult Living   1               Review of Systems  Constitutional: Negative for fever, diaphoresis and fatigue.  HENT: Negative for congestion, sore throat, rhinorrhea, sneezing, trouble swallowing, neck pain and postnasal drip.   Respiratory: Positive for cough and shortness of breath. Negative for wheezing.   Cardiovascular: Positive for chest pain. Negative for orthopnea, claudication and leg swelling.  Gastrointestinal: Negative for nausea, vomiting and abdominal pain.  Genitourinary: Negative for dysuria, urgency and hematuria.  Musculoskeletal: Negative for myalgias and back pain.  Neurological: Negative for dizziness, weakness, numbness and headaches.  All other systems reviewed and are negative.    Allergies  Review of patient's allergies indicates no known allergies.  Home Medications  No current outpatient prescriptions on file.  BP 128/89  Pulse 85  Temp(Src) 98.1 F (36.7 C) (Oral)  Resp 20  SpO2 100%  Breastfeeding? Unknown  Physical Exam  Vitals reviewed. Constitutional: She is oriented to person, place, and time. Vital signs are normal. She appears well-developed and well-nourished.  HENT:  Head: Normocephalic and atraumatic.  Mouth/Throat: Oropharynx is clear and moist. No oropharyngeal exudate.  Eyes: Conjunctivae are normal. Pupils are equal, round, and reactive to light.  Neck: Normal range of motion. Neck supple.  Cardiovascular: Normal rate, regular rhythm and normal heart sounds.  Exam reveals no friction rub.   No murmur heard. Pulmonary/Chest: Effort normal and breath sounds normal. She has no wheezes. She has no rhonchi. She has no rales. She exhibits no tenderness.  Musculoskeletal: Normal range of motion.  Neurological: She is alert and oriented to person, place, and time. Coordination normal.  Skin: Skin is warm and dry. No rash noted. No erythema. No pallor.     ED Course  Procedures   Results for orders placed during the hospital encounter of 06/12/11  D-DIMER, QUANTITATIVE      Component Value Range   D-Dimer, Quant 1.67 (*) 0.00 - 0.48 (ug/mL-FEU)  CBC      Component Value Range   WBC 6.3  4.0 - 10.5 (K/uL)   RBC 4.18  3.87 - 5.11 (MIL/uL)   Hemoglobin 11.8 (*) 12.0 - 15.0 (g/dL)   HCT 16.1 (*) 09.6 - 46.0 (%)   MCV 84.4  78.0 - 100.0 (fL)   MCH 28.2  26.0 - 34.0 (pg)   MCHC 33.4  30.0 - 36.0 (g/dL)   RDW 04.5  40.9 - 81.1 (%)   Platelets 322  150 - 400 (K/uL)  DIFFERENTIAL      Component Value Range   Neutrophils Relative 53  43 - 77 (%)   Neutro Abs 3.3  1.7 - 7.7 (K/uL)   Lymphocytes Relative 35  12 - 46 (%)   Lymphs Abs 2.2  0.7 - 4.0 (K/uL)   Monocytes Relative 7  3 - 12 (%)   Monocytes Absolute 0.4  0.1 - 1.0 (K/uL)   Eosinophils Relative 6 (*) 0 - 5 (%)   Eosinophils Absolute 0.4  0.0 - 0.7 (K/uL)   Basophils Relative 1  0 - 1 (%)   Basophils Absolute 0.0  0.0 - 0.1 (K/uL)  POCT I-STAT, CHEM 8      Component Value Range   Sodium 142  135 - 145 (mEq/L)   Potassium 3.8  3.5 - 5.1 (mEq/L)   Chloride 105  96 - 112 (mEq/L)   BUN 9  6 - 23 (mg/dL)   Creatinine, Ser 9.14  0.50 - 1.10 (mg/dL)   Glucose, Bld 89  70 - 99 (mg/dL)   Calcium, Ion 7.82  9.56 - 1.32 (mmol/L)   TCO2 26  0 - 100 (mmol/L)   Hemoglobin 12.6  12.0 - 15.0 (g/dL)   HCT 21.3  08.6 - 57.8 (%)   Dg Chest 2 View  06/12/2011  *RADIOLOGY REPORT*  Clinical Data: 21 year old female with shortness of breath, right chest pain and ribs soreness.  CHEST - 2 VIEW  Comparison: 05/29/2010.  Findings: Lower lung volumes.  Mild crowding of markings at both lung bases. Normal cardiac size and mediastinal contours. Visualized tracheal air column is within normal limits.  No pneumothorax, pulmonary edema, pleural effusion or confluent pulmonary opacity. No acute osseous abnormality identified.  IMPRESSION: Low lung volumes, otherwise no acute cardiopulmonary abnormality.   Original Report Authenticated By: Harley Hallmark, M.D.   Ct Angio Chest W/cm &/or Wo Cm  06/13/2011  *RADIOLOGY REPORT*  Clinical Data: Progressive shortness of breath and dry cough.  CT ANGIOGRAPHY CHEST  Technique:  Multidetector CT imaging of the chest using the standard protocol during bolus administration of intravenous contrast. Multiplanar reconstructed images including MIPs were obtained and reviewed to evaluate the vascular anatomy.  Contrast: OMNIPAQUE IOHEXOL 350 MG/ML SOLN  Comparison: Chest radiograph performed 06/12/2011  Findings: There is no evidence of pulmonary embolus.  Mild streaky  atelectasis is seen at both lung bases.  The lungs are otherwise clear.  There is no evidence of pleural effusion or pneumothorax.  No masses are identified; no abnormal focal contrast enhancement is seen.  The mediastinum is unremarkable in appearance.  No mediastinal lymphadenopathy is seen.  No pericardial effusion is identified. The great vessels are unremarkable in appearance.  No axillary lymphadenopathy is seen.  The visualized portions of the thyroid gland are unremarkable in appearance.  A few mildly prominent pericardial nodes are seen anterior to the liver, measuring up to 8 mm in short axis.  These are of uncertain significance.  The visualized portions of the liver and spleen are unremarkable. The visualized portions of the pancreas, gallbladder, stomach, adrenal glands and kidneys are within normal limits. The visualized portions of the gallbladder are relatively decompressed.  No acute osseous abnormalities are seen.  IMPRESSION:  1.  No evidence of pulmonary embolus. 2.  Mild streaky atelectasis at both lung bases; lungs otherwise clear. 3.  Few mildly prominent pericardial nodes anterior to the liver, measuring up to 8 mm in short axis.  These are of uncertain significance.  Original Report Authenticated By: Tonia Ghent, M.D.      MDM   12:42 AM Patient had an elevated d-dimer. Will  order CT Angio chest. Discussed with patient he voices understanding. Reports pain has improved with Toradol and Robaxin   2:48 AM CT was normal and did not indicate pulmonary embolism. Advised patient that she had abnormal nodes on liver and have this rechecked by primary care Dr. gastroenterologist in 6 months or sooner if symptoms worsen. Pt reports pain significantly improved after Toradol, Robaxin. Will discharge her with similar medications. Patient voices understanding and is ready for discharge     Thomasene Lot, Cordelia Poche 06/13/11 9528

## 2011-06-12 NOTE — ED Notes (Signed)
PT. REPORTS PROGRESSING SOB AND DRY COUGH FOR SEVERAL DAYS , SEEN AT AN URGENT CARE CLINIC TODAY SENT HERE FOR FURTHER EVALUATION .

## 2011-06-12 NOTE — ED Notes (Signed)
Meal tray given 

## 2011-06-12 NOTE — ED Notes (Signed)
Pt resting in stretcher in NAD, respirations even and unlabored. 

## 2011-06-13 ENCOUNTER — Emergency Department (HOSPITAL_COMMUNITY): Payer: 59

## 2011-06-13 LAB — CBC
HCT: 35.3 % — ABNORMAL LOW (ref 36.0–46.0)
Hemoglobin: 11.8 g/dL — ABNORMAL LOW (ref 12.0–15.0)
MCH: 28.2 pg (ref 26.0–34.0)
MCHC: 33.4 g/dL (ref 30.0–36.0)
MCV: 84.4 fL (ref 78.0–100.0)
Platelets: 322 10*3/uL (ref 150–400)
RBC: 4.18 MIL/uL (ref 3.87–5.11)
RDW: 13.8 % (ref 11.5–15.5)
WBC: 6.3 10*3/uL (ref 4.0–10.5)

## 2011-06-13 LAB — POCT I-STAT, CHEM 8
BUN: 9 mg/dL (ref 6–23)
Calcium, Ion: 1.23 mmol/L (ref 1.12–1.32)
Chloride: 105 mEq/L (ref 96–112)
Creatinine, Ser: 0.9 mg/dL (ref 0.50–1.10)
Glucose, Bld: 89 mg/dL (ref 70–99)
HCT: 37 % (ref 36.0–46.0)
Hemoglobin: 12.6 g/dL (ref 12.0–15.0)
Potassium: 3.8 mEq/L (ref 3.5–5.1)
Sodium: 142 mEq/L (ref 135–145)
TCO2: 26 mmol/L (ref 0–100)

## 2011-06-13 LAB — DIFFERENTIAL
Basophils Absolute: 0 10*3/uL (ref 0.0–0.1)
Basophils Relative: 1 % (ref 0–1)
Eosinophils Absolute: 0.4 10*3/uL (ref 0.0–0.7)
Eosinophils Relative: 6 % — ABNORMAL HIGH (ref 0–5)
Lymphocytes Relative: 35 % (ref 12–46)
Lymphs Abs: 2.2 10*3/uL (ref 0.7–4.0)
Monocytes Absolute: 0.4 10*3/uL (ref 0.1–1.0)
Monocytes Relative: 7 % (ref 3–12)
Neutro Abs: 3.3 10*3/uL (ref 1.7–7.7)
Neutrophils Relative %: 53 % (ref 43–77)

## 2011-06-13 LAB — D-DIMER, QUANTITATIVE: D-Dimer, Quant: 1.67 ug/mL-FEU — ABNORMAL HIGH (ref 0.00–0.48)

## 2011-06-13 MED ORDER — IOHEXOL 350 MG/ML SOLN
100.0000 mL | Freq: Once | INTRAVENOUS | Status: AC | PRN
  Administered 2011-06-13: 100 mL via INTRAVENOUS

## 2011-06-13 MED ORDER — DICLOFENAC SODIUM 75 MG PO TBEC: 75.0000 mg | DELAYED_RELEASE_TABLET | Freq: Two times a day (BID) | ORAL | Status: DC

## 2011-06-13 MED ORDER — METHOCARBAMOL 500 MG PO TABS: 500.0000 mg | ORAL_TABLET | Freq: Two times a day (BID) | ORAL | Status: AC

## 2011-06-13 NOTE — ED Notes (Signed)
Patient transported to CT 

## 2011-06-13 NOTE — ED Notes (Signed)
Pt back from CT.  Pt states feeling fine at this time, denies any needs.

## 2011-06-13 NOTE — Discharge Instructions (Signed)
Chest Pain (Nonspecific) It is often hard to give a specific diagnosis for the cause of chest pain. There is always a chance that your pain could be related to something serious, such as a heart attack or a blood clot in the lungs. You need to follow up with your caregiver for further evaluation. CAUSES   Heartburn.   Pneumonia or bronchitis.   Anxiety or stress.   Inflammation around your heart (pericarditis) or lung (pleuritis or pleurisy).   A blood clot in the lung.   A collapsed lung (pneumothorax). It can develop suddenly on its own (spontaneous pneumothorax) or from injury (trauma) to the chest.   Shingles infection (herpes zoster virus).  The chest wall is composed of bones, muscles, and cartilage. Any of these can be the source of the pain.  The bones can be bruised by injury.   The muscles or cartilage can be strained by coughing or overwork.   The cartilage can be affected by inflammation and become sore (costochondritis).  DIAGNOSIS  Lab tests or other studies, such as X-rays, electrocardiography, stress testing, or cardiac imaging, may be needed to find the cause of your pain.  TREATMENT   Treatment depends on what may be causing your chest pain. Treatment may include:   Acid blockers for heartburn.   Anti-inflammatory medicine.   Pain medicine for inflammatory conditions.   Antibiotics if an infection is present.   You may be advised to change lifestyle habits. This includes stopping smoking and avoiding alcohol, caffeine, and chocolate.   You may be advised to keep your head raised (elevated) when sleeping. This reduces the chance of acid going backward from your stomach into your esophagus.   Most of the time, nonspecific chest pain will improve within 2 to 3 days with rest and mild pain medicine.  HOME CARE INSTRUCTIONS   If antibiotics were prescribed, take your antibiotics as directed. Finish them even if you start to feel better.   For the next few  days, avoid physical activities that bring on chest pain. Continue physical activities as directed.   Do not smoke.   Avoid drinking alcohol.   Only take over-the-counter or prescription medicine for pain, discomfort, or fever as directed by your caregiver.   Follow your caregiver's suggestions for further testing if your chest pain does not go away.   Keep any follow-up appointments you made. If you do not go to an appointment, you could develop lasting (chronic) problems with pain. If there is any problem keeping an appointment, you must call to reschedule.  SEEK MEDICAL CARE IF:   You think you are having problems from the medicine you are taking. Read your medicine instructions carefully.   Your chest pain does not go away, even after treatment.   You develop a rash with blisters on your chest.  SEEK IMMEDIATE MEDICAL CARE IF:   You have increased chest pain or pain that spreads to your arm, neck, jaw, back, or abdomen.   You develop shortness of breath, an increasing cough, or you are coughing up blood.   You have severe back or abdominal pain, feel nauseous, or vomit.   You develop severe weakness, fainting, or chills.   You have a fever.  THIS IS AN EMERGENCY. Do not wait to see if the pain will go away. Get medical help at once. Call your local emergency services (911 in [place] Do not drive yourself to the hospital. MAKE SURE YOU:   Understand these instructions.     Will watch your condition.   Will get help right away if you are not doing well or get worse.  Document Released: 11/05/2004 Document Revised: 01/15/2011 Document Reviewed: 09/01/2007 [employer] Patient Information 2012 [employer]. 

## 2011-06-14 NOTE — ED Provider Notes (Signed)
Medical screening examination/treatment/procedure(s) were performed by non-physician practitioner and as supervising physician I was immediately available for consultation/collaboration.   Shelda Jakes, MD 06/14/11 [phone]

## 2011-08-21 ENCOUNTER — Emergency Department (INDEPENDENT_AMBULATORY_CARE_PROVIDER_SITE_OTHER)
Admission: EM | Admit: 2011-08-21 | Discharge: 2011-08-21 | Disposition: A | Discharge status: Home / Self Care | Payer: 59 | Source: Home / Self Care | Attending: Emergency Medicine | Admitting: Emergency Medicine

## 2011-08-21 ENCOUNTER — Encounter (HOSPITAL_COMMUNITY): Payer: Self-pay | Admitting: Emergency Medicine

## 2011-08-21 DIAGNOSIS — M654 Radial styloid tenosynovitis [de Quervain]: Secondary | ICD-10-CM

## 2011-08-21 MED ORDER — MELOXICAM 15 MG PO TABS: 15.0000 mg | ORAL_TABLET | Freq: Every day | ORAL | Status: DC

## 2011-08-21 NOTE — ED Notes (Addendum)
PT HERE WITH LEFT WRIST PAIN RADIATING TO FINGERS WITH SWELLING AND DECREASE ROM.ABLE TO MAKE A FIST BUT SORE.ICE/HEAT USED.SX STARTED 2 DYS AGO

## 2011-08-21 NOTE — ED Provider Notes (Addendum)
History     CSN: [id number]  Arrival date & time 08/21/11  1503   First MD Initiated Contact with Patient 08/21/11 1518      Chief Complaint  Patient presents with  . Hand Pain    (Consider location/radiation/quality/duration/timing/severity/associated sxs/prior treatment) HPI Comments: Patient is a right-handed female who reports several days of medial left wrist pain, which is worse with grasping and lifting heavy objects, ulnar deviation. Some swelling. States it started after doing a particularly heavy workouts pressure is doing a lot of weight lifting, upper body work. No erythema, numbness, weakness. No recent or remote history of injury to her left hand or wrist. She does not do a lot of repetitive motions at work. She is a smoker, but is not a diabetic.  Patient is a 21 y.o. female presenting with wrist pain. The history is provided by the patient. No language interpreter was used.  Wrist Pain This is a new problem. The current episode started more than 2 days ago. The problem occurs constantly. The problem has not changed since onset.Exacerbated by: use, grasping objects. Nothing relieves the symptoms. She has tried nothing for the symptoms. The treatment provided no relief.    Past Medical History  Diagnosis Date  . Headache     Past Surgical History  Procedure Date  . Cesarean section     Family History  Problem Relation Age of Onset  . Anesthesia problems Neg Hx   . Hypotension Neg Hx   . Malignant hyperthermia Neg Hx   . Pseudochol deficiency Neg Hx     History  Substance Use Topics  . Smoking status: Current Everyday Smoker -- 0.2 packs/day for 4 years    Types: Cigarettes    Last Attempt to Quit: 02/26/2011  . Smokeless tobacco: Not on file  . Alcohol Use: No    OB History    Grav Para Term Preterm Abortions TAB SAB Ect Mult Living   1               Review of Systems  Allergies  Review of patient's allergies indicates no known  allergies.  Home Medications   Current Outpatient Rx  Name Route Sig Dispense Refill  . MELOXICAM 15 MG PO TABS Oral Take 1 tablet (15 mg total) by mouth daily. 14 tablet 0    BP 104/72  Pulse 70  Temp 98.7 F (37.1 C) (Oral)  Resp 18  SpO2 98%  LMP 07/25/2011  Physical Exam  Nursing note and vitals reviewed. Constitutional: She is oriented to person, place, and time. She appears well-developed and well-nourished. No distress.  HENT:  Head: Normocephalic and atraumatic.  Eyes: Conjunctivae and EOM are normal.  Neck: Normal range of motion.  Cardiovascular: Normal rate.   Pulmonary/Chest: Effort normal.  Abdominal: She exhibits no distension.  Musculoskeletal: Normal range of motion.       (+) finkelstein (+) tenderness over APB/EPB, L distal radius NT , distal ulnar styloid NT , snuffbox NT, carpals NT , metacarpals NT, digits NT , Motor intact ability to flex / extend digits of affected hand, Sensation LT to hand normal,  RP 2+, Elbow and proximal forearm NT    Neurological: She is alert and oriented to person, place, and time. Coordination normal.  Skin: Skin is warm and dry.  Psychiatric: She has a normal mood and affect. Her behavior is normal. Judgment and thought content normal.    ED Course  Procedures (including critical care time)  Labs Reviewed -  No data to display No results found.   1. De Quervain's tenosynovitis, left     MDM  No bony tenderness, history of trauma. Doubt fracture, deferring x-ray. Relative rest, ice, Mobic. Placing patient in thumb spica splint. Followup with Dr. Mina Marble, hand surgeon on call, if no improvement with conservative treatment in 10 days.  Luiz Blare, MD 08/22/11 1610  Luiz Blare, MD 08/22/11 2156

## 2012-01-19 ENCOUNTER — Emergency Department (INDEPENDENT_AMBULATORY_CARE_PROVIDER_SITE_OTHER)
Admission: EM | Admit: 2012-01-19 | Discharge: 2012-01-19 | Disposition: A | Discharge status: Home / Self Care | Payer: 59 | Source: Home / Self Care | Attending: Emergency Medicine | Admitting: Emergency Medicine

## 2012-01-19 ENCOUNTER — Encounter (HOSPITAL_COMMUNITY): Payer: Self-pay | Admitting: *Deleted

## 2012-01-19 DIAGNOSIS — K0889 Other specified disorders of teeth and supporting structures: Secondary | ICD-10-CM

## 2012-01-19 DIAGNOSIS — K089 Disorder of teeth and supporting structures, unspecified: Secondary | ICD-10-CM

## 2012-01-19 DIAGNOSIS — R51 Headache: Secondary | ICD-10-CM

## 2012-01-19 DIAGNOSIS — H9209 Otalgia, unspecified ear: Secondary | ICD-10-CM

## 2012-01-19 MED ORDER — IBUPROFEN 800 MG PO TABS: 800.0000 mg | ORAL_TABLET | Freq: Three times a day (TID) | ORAL | Status: DC | PRN

## 2012-01-19 NOTE — ED Provider Notes (Signed)
And IHistory     CSN: [id number]  Arrival date & time 01/19/12  1712   First MD Initiated Contact with Patient 01/19/12 1825      Chief Complaint  Patient presents with  . Dental Pain    (Consider location/radiation/quality/duration/timing/severity/associated sxs/prior treatment) HPI Comments: The patient presents urgent care complaining of ongoing bilateral dental pain and bilateral ear pain and she described that she had before molars extracted last week. She has been taking amoxicillin and Vicodin that was prescribed by her oral surgeon. She tried to call them today but they said they cannot see her until January. Patient denies any fevers, facial swelling or any obvious oral bleeding.  Patient is a 21 y.o. female presenting with tooth pain. The history is provided by the patient.  Dental PainThe primary symptoms include mouth pain and headaches. Primary symptoms do not include dental injury, oral bleeding, oral lesions, shortness of breath, sore throat, angioedema or cough. The symptoms began 5 to 7 days ago. The symptoms are unchanged. The symptoms occur constantly.  Additional symptoms include: ear pain. Additional symptoms do not include: trouble swallowing, drooling, swollen glands, goiter and fatigue.    Past Medical History  Diagnosis Date  . Headache     Past Surgical History  Procedure Date  . Cesarean section     Family History  Problem Relation Age of Onset  . Anesthesia problems Neg Hx   . Hypotension Neg Hx   . Malignant hyperthermia Neg Hx   . Pseudochol deficiency Neg Hx     History  Substance Use Topics  . Smoking status: Current Every Day Smoker -- 0.2 packs/day for 4 years    Types: Cigarettes    Last Attempt to Quit: 02/26/2011  . Smokeless tobacco: Not on file  . Alcohol Use: No    OB History    Grav Para Term Preterm Abortions TAB SAB Ect Mult Living   1               Review of Systems  Constitutional: Negative for chills, activity  change, appetite change and fatigue.  HENT: Positive for ear pain. Negative for sore throat, drooling, mouth sores and trouble swallowing.   Respiratory: Negative for cough and shortness of breath.   Neurological: Positive for headaches.    Allergies  Review of patient's allergies indicates no known allergies.  Home Medications   Current Outpatient Rx  Name  Route  Sig  Dispense  Refill  . AMOXICILLIN 500 MG PO CAPS   Oral   Take 500 mg by mouth 3 (three) times daily.         Marland Kitchen HYDROCODONE-ACETAMINOPHEN 5-325 MG PO TABS   Oral   Take 1 tablet by mouth every 6 (six) hours as needed.         . IBUPROFEN 800 MG PO TABS   Oral   Take 1 tablet (800 mg total) by mouth every 8 (eight) hours as needed for pain.   15 tablet   0     BP 128/84  Pulse 75  Temp 98.3 F (36.8 C) (Oral)  Resp 16  SpO2 100%  LMP 12/13/2011  Physical Exam  Nursing note and vitals reviewed. Constitutional: She appears well-developed and well-nourished.  HENT:  Head: Normocephalic and atraumatic.  Right Ear: Tympanic membrane, external ear and ear canal normal.  Left Ear: Hearing, tympanic membrane, external ear and ear canal normal.  Mouth/Throat: No oropharyngeal exudate.    Eyes: Conjunctivae normal are normal.  Neck: Neck supple. No JVD present.  Pulmonary/Chest: Effort normal.  Abdominal: Soft.  Lymphadenopathy:    She has no cervical adenopathy.  Neurological: She is alert.  Skin: No erythema.    ED Course  Procedures (including critical care time)  Labs Reviewed - No data to display No results found.   1. Pain, dental       MDM  Status post for molar extractions 4 days ago. Exam reveal no signs of infection or active bleeding at molar removal sites. Have encouraged patient to take ibuprofen every 8 hours and to contact her all surgeon tomorrow. Both her oral exam and ear exam were unremarkable        Jimmie Molly, MD 01/19/12 [phone]

## 2012-01-19 NOTE — ED Notes (Signed)
4 days of dental/bilateral ear pain after having dental extractions a week ago.   She has taken amoxicillin and vicodin that was given to her, but she is still having a lot of pain

## 2012-02-07 ENCOUNTER — Encounter (HOSPITAL_COMMUNITY): Payer: Self-pay | Admitting: Emergency Medicine

## 2012-02-07 ENCOUNTER — Emergency Department (INDEPENDENT_AMBULATORY_CARE_PROVIDER_SITE_OTHER)
Admission: EM | Admit: 2012-02-07 | Discharge: 2012-02-07 | Disposition: A | Discharge status: Home / Self Care | Payer: Medicaid Other | Source: Home / Self Care

## 2012-02-07 DIAGNOSIS — J039 Acute tonsillitis, unspecified: Secondary | ICD-10-CM

## 2012-02-07 LAB — POCT RAPID STREP A: Streptococcus, Group A Screen (Direct): NEGATIVE

## 2012-02-07 MED ORDER — PENICILLIN V POTASSIUM 500 MG PO TABS: 500.0000 mg | ORAL_TABLET | Freq: Four times a day (QID) | ORAL | Status: AC

## 2012-02-07 MED ORDER — HYDROCODONE-ACETAMINOPHEN 5-325 MG PO TABS: 2.0000 | ORAL_TABLET | ORAL | Status: DC | PRN

## 2012-02-07 NOTE — ED Provider Notes (Signed)
Medical screening examination/treatment/procedure(s) were performed by non-physician practitioner and as supervising physician I was immediately available for consultation/collaboration.  Leslee Home, M.D.   Reuben Likes, MD 02/07/12 [id number]

## 2012-02-07 NOTE — ED Notes (Signed)
Pt c/o severe sore throat with swelling. Pt states that it hurts to talk,swallow. Constant headache. Low grade temp. Pt has tried otc meds with no relief in symptoms.

## 2012-02-07 NOTE — ED Provider Notes (Signed)
History     CSN: [id number]  Arrival date & time 02/07/12  1422   None     Chief Complaint  Patient presents with  . Sore Throat    throat is swollen, hurts to talk swallow. consant headache. low grade temp. and diarrhea.    (Consider location/radiation/quality/duration/timing/severity/associated sxs/prior treatment) Patient is a 21 y.o. female presenting with pharyngitis. The history is provided by the patient. No language interpreter was used.  Sore Throat This is a new problem. The current episode started more than 1 week ago. The problem occurs constantly. The problem has been gradually worsening. Pertinent negatives include no chest pain. Nothing aggravates the symptoms. Nothing relieves the symptoms. She has tried nothing for the symptoms.   Pt complains of a sore throat and fever Past Medical History  Diagnosis Date  . Headache     Past Surgical History  Procedure Date  . Cesarean section     Family History  Problem Relation Age of Onset  . Anesthesia problems Neg Hx   . Hypotension Neg Hx   . Malignant hyperthermia Neg Hx   . Pseudochol deficiency Neg Hx     History  Substance Use Topics  . Smoking status: Current Every Day Smoker -- 0.2 packs/day for 4 years    Types: Cigarettes    Last Attempt to Quit: 02/26/2011  . Smokeless tobacco: Not on file  . Alcohol Use: No    OB History    Grav Para Term Preterm Abortions TAB SAB Ect Mult Living   1               Review of Systems  Constitutional: Positive for fever.  HENT: Positive for sore throat.   Cardiovascular: Negative for chest pain.  All other systems reviewed and are negative.    Allergies  Review of patient's allergies indicates no known allergies.  Home Medications   Current Outpatient Rx  Name  Route  Sig  Dispense  Refill  . IBUPROFEN 800 MG PO TABS   Oral   Take 1 tablet (800 mg total) by mouth every 8 (eight) hours as needed for pain.   15 tablet   0   . AMOXICILLIN 500 MG  PO CAPS   Oral   Take 500 mg by mouth 3 (three) times daily.         Marland Kitchen HYDROCODONE-ACETAMINOPHEN 5-325 MG PO TABS   Oral   Take 1 tablet by mouth every 6 (six) hours as needed.           BP 123/88  Pulse 80  Temp 99 F (37.2 C) (Oral)  Resp 18  SpO2 99%  LMP 12/15/2011  Physical Exam  Nursing note and vitals reviewed. Constitutional: She appears well-developed and well-nourished.  HENT:  Head: Normocephalic and atraumatic.  Right Ear: External ear normal.  Left Ear: External ear normal.       Swollen tonsils, erythema  Eyes: Conjunctivae normal and EOM are normal. Pupils are equal, round, and reactive to light.  Neck: Normal range of motion. Neck supple.  Cardiovascular: Normal rate and normal heart sounds.   Pulmonary/Chest: Effort normal and breath sounds normal.  Musculoskeletal: Normal range of motion.  Neurological: She is alert.  Skin: Skin is warm.    ED Course  Procedures (including critical care time)   Labs Reviewed  POCT RAPID STREP A ([facility] ONLY)   No results found.   1. Tonsillitis       MDM  pcn  vk 500mg  qid x 10  hydrocodone        Lonia Skinner [place], [place] 02/07/12 1719  Lonia Skinner [place], [place] 02/07/12 1721

## 2012-02-07 NOTE — ED Notes (Signed)
Waiting discharge papers 

## 2012-07-15 ENCOUNTER — Encounter (HOSPITAL_COMMUNITY): Payer: Self-pay

## 2012-07-15 ENCOUNTER — Emergency Department (HOSPITAL_COMMUNITY)
Admission: EM | Admit: 2012-07-15 | Discharge: 2012-07-15 | Disposition: A | Discharge status: Home / Self Care | Payer: Medicaid Other | Attending: Emergency Medicine | Admitting: Emergency Medicine

## 2012-07-15 DIAGNOSIS — F17200 Nicotine dependence, unspecified, uncomplicated: Secondary | ICD-10-CM | POA: Insufficient documentation

## 2012-07-15 DIAGNOSIS — B9689 Other specified bacterial agents as the cause of diseases classified elsewhere: Secondary | ICD-10-CM

## 2012-07-15 DIAGNOSIS — Z3202 Encounter for pregnancy test, result negative: Secondary | ICD-10-CM | POA: Insufficient documentation

## 2012-07-15 DIAGNOSIS — Z87440 Personal history of urinary (tract) infections: Secondary | ICD-10-CM | POA: Insufficient documentation

## 2012-07-15 DIAGNOSIS — N760 Acute vaginitis: Secondary | ICD-10-CM

## 2012-07-15 DIAGNOSIS — J020 Streptococcal pharyngitis: Secondary | ICD-10-CM

## 2012-07-15 DIAGNOSIS — R3915 Urgency of urination: Secondary | ICD-10-CM | POA: Insufficient documentation

## 2012-07-15 HISTORY — DX: Urinary tract infection, site not specified: N39.0

## 2012-07-15 LAB — WET PREP, GENITAL
Trich, Wet Prep: NONE SEEN
Yeast Wet Prep HPF POC: NONE SEEN

## 2012-07-15 LAB — URINALYSIS, ROUTINE W REFLEX MICROSCOPIC
Bilirubin Urine: NEGATIVE
Glucose, UA: NEGATIVE mg/dL
Hgb urine dipstick: NEGATIVE
Ketones, ur: NEGATIVE mg/dL
Leukocytes, UA: NEGATIVE
Nitrite: NEGATIVE
Protein, ur: NEGATIVE mg/dL
Specific Gravity, Urine: 1.02 (ref 1.005–1.030)
Urobilinogen, UA: 1 mg/dL (ref 0.0–1.0)
pH: 7.5 (ref 5.0–8.0)

## 2012-07-15 LAB — CBC WITH DIFFERENTIAL/PLATELET
Basophils Absolute: 0 10*3/uL (ref 0.0–0.1)
Basophils Relative: 0 % (ref 0–1)
Eosinophils Absolute: 0.1 10*3/uL (ref 0.0–0.7)
Eosinophils Relative: 1 % (ref 0–5)
HCT: 36.7 % (ref 36.0–46.0)
Hemoglobin: 12.7 g/dL (ref 12.0–15.0)
Lymphocytes Relative: 15 % (ref 12–46)
Lymphs Abs: 1.6 10*3/uL (ref 0.7–4.0)
MCH: 29.3 pg (ref 26.0–34.0)
MCHC: 34.6 g/dL (ref 30.0–36.0)
MCV: 84.6 fL (ref 78.0–100.0)
Monocytes Absolute: 0.9 10*3/uL (ref 0.1–1.0)
Monocytes Relative: 9 % (ref 3–12)
Neutro Abs: 8.4 10*3/uL — ABNORMAL HIGH (ref 1.7–7.7)
Neutrophils Relative %: 76 % (ref 43–77)
Platelets: 308 10*3/uL (ref 150–400)
RBC: 4.34 MIL/uL (ref 3.87–5.11)
RDW: 13.3 % (ref 11.5–15.5)
WBC: 11.1 10*3/uL — ABNORMAL HIGH (ref 4.0–10.5)

## 2012-07-15 LAB — COMPREHENSIVE METABOLIC PANEL
ALT: 13 U/L (ref 0–35)
AST: 13 U/L (ref 0–37)
Albumin: 3.6 g/dL (ref 3.5–5.2)
Alkaline Phosphatase: 74 U/L (ref 39–117)
BUN: 5 mg/dL — ABNORMAL LOW (ref 6–23)
CO2: 25 mEq/L (ref 19–32)
Calcium: 9.5 mg/dL (ref 8.4–10.5)
Chloride: 98 mEq/L (ref 96–112)
Creatinine, Ser: 0.86 mg/dL (ref 0.50–1.10)
GFR calc Af Amer: >90 mL/min (ref >90)
GFR calc non Af Amer: >90 mL/min (ref >90)
Glucose, Bld: 85 mg/dL (ref 70–99)
Potassium: 3.5 mEq/L (ref 3.5–5.1)
Sodium: 133 mEq/L — ABNORMAL LOW (ref 135–145)
Total Bilirubin: 0.5 mg/dL (ref 0.3–1.2)
Total Protein: 7.2 g/dL (ref 6.0–8.3)

## 2012-07-15 LAB — RAPID STREP SCREEN (MED CTR MEBANE ONLY): Streptococcus, Group A Screen (Direct): POSITIVE — AB

## 2012-07-15 LAB — POCT PREGNANCY, URINE: Preg Test, Ur: NEGATIVE

## 2012-07-15 MED ORDER — OXYCODONE-ACETAMINOPHEN 5-325 MG PO TABS
2.0000 | ORAL_TABLET | Freq: Once | ORAL | Status: AC
  Administered 2012-07-15: 2 via ORAL
  Filled 2012-07-15: qty 2

## 2012-07-15 MED ORDER — ONDANSETRON 4 MG PO TBDP
4.0000 mg | ORAL_TABLET | Freq: Once | ORAL | Status: AC
  Administered 2012-07-15: 4 mg via ORAL
  Filled 2012-07-15: qty 1

## 2012-07-15 MED ORDER — METRONIDAZOLE 500 MG PO TABS: 500.0000 mg | ORAL_TABLET | Freq: Two times a day (BID) | ORAL | Status: DC

## 2012-07-15 MED ORDER — OXYCODONE-ACETAMINOPHEN 5-325 MG PO TABS: 2.0000 | ORAL_TABLET | Freq: Four times a day (QID) | ORAL | Status: DC | PRN

## 2012-07-15 MED ORDER — DEXAMETHASONE SODIUM PHOSPHATE 10 MG/ML IJ SOLN
10.0000 mg | Freq: Once | INTRAMUSCULAR | Status: AC
  Administered 2012-07-15: 10 mg
  Filled 2012-07-15: qty 1

## 2012-07-15 MED ORDER — PENICILLIN V POTASSIUM 500 MG PO TABS: 500.0000 mg | ORAL_TABLET | Freq: Two times a day (BID) | ORAL | Status: AC

## 2012-07-15 NOTE — ED Notes (Signed)
Patient reports that she had had body aches x 4 days and urinary frequency and urgency x 2 days. Patient also c/o chills.

## 2012-07-15 NOTE — ED Provider Notes (Signed)
History     CSN: [id number]  Arrival date & time 07/15/12  1442   First MD Initiated Contact with Patient 07/15/12 1718      Chief Complaint  Patient presents with  . Back Pain  . Urinary Urgency    (Consider location/radiation/quality/duration/timing/severity/associated sxs/prior treatment) HPI Comments: Patient is a 22 year old female who presents today with 2 days of gradually worsening mid back pain, suprapubic pain, sore throat, congestion, myalgias. She states she has been having worsening dysuria, urinary urgency, urinary frequency. She states she had a fever of 102 prior to coming here, but on arrival her temperature was 100F. Nothing makes her symptoms worse. Nothing makes her symptoms better. She has taken advil and tylenol without relief. No vaginal discharge. Last menstrual period was 4 weeks ago.   Patient is a 22 y.o. female presenting with back pain. The history is provided by the patient. No language interpreter was used.  Back Pain Associated symptoms: abdominal pain, dysuria and fever   Associated symptoms: no chest pain and no pelvic pain     Past Medical History  Diagnosis Date  . Headache(784.0)   . UTI (urinary tract infection)     Past Surgical History  Procedure Laterality Date  . Cesarean section      Family History  Problem Relation Age of Onset  . Anesthesia problems Neg Hx   . Hypotension Neg Hx   . Malignant hyperthermia Neg Hx   . Pseudochol deficiency Neg Hx     History  Substance Use Topics  . Smoking status: Current Every Day Smoker -- 0.25 packs/day for 4 years    Types: Cigarettes    Last Attempt to Quit: 02/26/2011  . Smokeless tobacco: Never Used  . Alcohol Use: No    OB History   Grav Para Term Preterm Abortions TAB SAB Ect Mult Living   1               Review of Systems  Constitutional: Positive for fever and chills.  HENT: Positive for congestion and sore throat. Negative for drooling.   Respiratory: Negative for  shortness of breath.   Cardiovascular: Negative for chest pain.  Gastrointestinal: Positive for nausea and abdominal pain. Negative for vomiting.  Genitourinary: Positive for dysuria and frequency. Negative for vaginal bleeding, vaginal discharge and pelvic pain.  Musculoskeletal: Positive for back pain.  All other systems reviewed and are negative.    Allergies  Review of patient's allergies indicates no known allergies.  Home Medications   Current Outpatient Rx  Name  Route  Sig  Dispense  Refill  . ibuprofen (ADVIL,MOTRIN) 800 MG tablet   Oral   Take 1 tablet (800 mg total) by mouth every 8 (eight) hours as needed for pain.   15 tablet   0     BP 112/66  Pulse 94  Temp(Src) 100.1 F (37.8 C) (Oral)  Resp 18  Ht 5\' 6"  (1.676 m)  Wt 231 lb 8 oz (105.008 kg)  BMI 37.38 kg/m2  SpO2 98%  LMP 06/10/2012  Physical Exam  Nursing note and vitals reviewed. Constitutional: She is oriented to person, place, and time. She appears well-developed and well-nourished. No distress.  HENT:  Head: Normocephalic and atraumatic.  Right Ear: External ear normal.  Left Ear: External ear normal.  Nose: Nose normal.  Mouth/Throat: Uvula is midline and mucous membranes are normal. Posterior oropharyngeal erythema present. No tonsillar abscesses.  No trismus, submental edema, tongue elevation Bilateral, symmetrical enlargement of tonsils with  exudate  Eyes: Conjunctivae and lids are normal.  Neck: Normal range of motion.  Cardiovascular: Normal rate, regular rhythm and normal heart sounds.   Pulmonary/Chest: Effort normal and breath sounds normal. No stridor. No respiratory distress. She has no wheezes. She has no rales.  Abdominal: Soft. She exhibits no distension. There is tenderness in the suprapubic area. There is CVA tenderness. There is no rigidity, no rebound and no guarding.  Genitourinary: Cervix exhibits no motion tenderness and no friability. Right adnexum displays no mass, no  tenderness and no fullness. Left adnexum displays no mass, no tenderness and no fullness.  Musculoskeletal: Normal range of motion.  Lymphadenopathy:    She has cervical adenopathy.  Neurological: She is alert and oriented to person, place, and time. She has normal strength.  Skin: Skin is warm and dry. She is not diaphoretic. No erythema.  Psychiatric: She has a normal mood and affect. Her behavior is normal.    ED Course  Procedures (including critical care time)  Labs Reviewed  RAPID STREP SCREEN - Abnormal; Notable for the following:    Streptococcus, Group A Screen (Direct) POSITIVE (*)    All other components within normal limits  WET PREP, GENITAL - Abnormal; Notable for the following:    Clue Cells Wet Prep HPF POC MANY (*)    WBC, Wet Prep HPF POC FEW (*)    All other components within normal limits  CBC WITH DIFFERENTIAL - Abnormal; Notable for the following:    WBC 11.1 (*)    Neutro Abs 8.4 (*)    All other components within normal limits  COMPREHENSIVE METABOLIC PANEL - Abnormal; Notable for the following:    Sodium 133 (*)    BUN 5 (*)    All other components within normal limits  GC/CHLAMYDIA PROBE AMP  URINALYSIS, ROUTINE W REFLEX MICROSCOPIC  HETEROPHILE AB, REFLEX TO TITER, BLOOD  POCT PREGNANCY, URINE   No results found.   1. Strep pharyngitis   2. Bacterial vaginosis       MDM  Pt febrile with tonsillar exudate, cervical lymphadenopathy, & dysphagia; diagnosis of strep. Treated in the Ed with steroids. Presentation non concerning for PTA or infxn spread to soft tissue. No trismus or uvula deviation. Specific return precautions discussed. Pt able to drink water in ED without difficulty with intact air way. Recommended PCP follow up.   Patient also had urinary complaints. UA negative. Pelvic exam benign, no CMT. Wet prep shows clue cells. Treated for BV.   Discussed case with Dr. Jeraldine Loots who agrees with plan. Vital signs stable for discharge. Patient  / Family / Caregiver informed of clinical course, understand medical decision-making process, and agree with plan.   Medications  ondansetron (ZOFRAN-ODT) disintegrating tablet 4 mg (4 mg Oral Given 07/15/12 1832)  oxyCODONE-acetaminophen (PERCOCET/ROXICET) 5-325 MG per tablet 2 tablet (2 tablets Oral Given 07/15/12 1832)  dexamethasone (DECADRON) injection 10 mg (10 mg Other Given 07/15/12 1934)          Mora Bellman, PA-C 07/16/12 1114

## 2012-07-15 NOTE — Progress Notes (Signed)
Pt confirms pcp is [facility]  EPIC updated

## 2012-07-15 NOTE — ED Notes (Signed)
Nurse attempting to obtain labs with IV start 

## 2012-07-16 LAB — GC/CHLAMYDIA PROBE AMP
CT Probe RNA: NEGATIVE
GC Probe RNA: NEGATIVE

## 2012-07-16 NOTE — ED Provider Notes (Signed)
  Medical screening examination/treatment/procedure(s) were performed by non-physician practitioner and as supervising physician I was immediately available for consultation/collaboration.    Tecia Cinnamon, MD 07/16/12 1851 

## 2012-07-19 LAB — HETEROPHILE AB, REFLEX TO TITER, BLOOD: Heterophile Ab Screen: NEGATIVE

## 2012-11-13 ENCOUNTER — Emergency Department (HOSPITAL_COMMUNITY)
Admission: EM | Admit: 2012-11-13 | Discharge: 2012-11-13 | Disposition: A | Discharge status: Home / Self Care | Payer: BC Managed Care – PPO | Attending: Emergency Medicine | Admitting: Emergency Medicine

## 2012-11-13 ENCOUNTER — Encounter (HOSPITAL_COMMUNITY): Payer: Self-pay | Admitting: Emergency Medicine

## 2012-11-13 ENCOUNTER — Emergency Department (HOSPITAL_COMMUNITY): Payer: BC Managed Care – PPO

## 2012-11-13 DIAGNOSIS — N390 Urinary tract infection, site not specified: Secondary | ICD-10-CM | POA: Insufficient documentation

## 2012-11-13 DIAGNOSIS — Z3202 Encounter for pregnancy test, result negative: Secondary | ICD-10-CM | POA: Insufficient documentation

## 2012-11-13 DIAGNOSIS — J40 Bronchitis, not specified as acute or chronic: Secondary | ICD-10-CM

## 2012-11-13 DIAGNOSIS — R52 Pain, unspecified: Secondary | ICD-10-CM | POA: Insufficient documentation

## 2012-11-13 DIAGNOSIS — R110 Nausea: Secondary | ICD-10-CM | POA: Insufficient documentation

## 2012-11-13 DIAGNOSIS — J209 Acute bronchitis, unspecified: Secondary | ICD-10-CM | POA: Insufficient documentation

## 2012-11-13 DIAGNOSIS — F17200 Nicotine dependence, unspecified, uncomplicated: Secondary | ICD-10-CM | POA: Insufficient documentation

## 2012-11-13 LAB — POCT PREGNANCY, URINE: Preg Test, Ur: NEGATIVE

## 2012-11-13 LAB — URINALYSIS, ROUTINE W REFLEX MICROSCOPIC
Bilirubin Urine: NEGATIVE
Glucose, UA: NEGATIVE mg/dL
Hgb urine dipstick: NEGATIVE
Ketones, ur: NEGATIVE mg/dL
Nitrite: POSITIVE — AB
Protein, ur: NEGATIVE mg/dL
Specific Gravity, Urine: 1.024 (ref 1.005–1.030)
Urobilinogen, UA: 0.2 mg/dL (ref 0.0–1.0)
pH: 6 (ref 5.0–8.0)

## 2012-11-13 LAB — URINE MICROSCOPIC-ADD ON

## 2012-11-13 LAB — GLUCOSE, CAPILLARY: Glucose-Capillary: 108 mg/dL — ABNORMAL HIGH (ref 70–99)

## 2012-11-13 MED ORDER — SULFAMETHOXAZOLE-TMP DS 800-160 MG PO TABS: 1.0000 | ORAL_TABLET | Freq: Two times a day (BID) | ORAL | Status: DC

## 2012-11-13 MED ORDER — ALBUTEROL SULFATE HFA 108 (90 BASE) MCG/ACT IN AERS
2.0000 | INHALATION_SPRAY | Freq: Once | RESPIRATORY_TRACT | Status: AC
  Administered 2012-11-13: 2 via RESPIRATORY_TRACT
  Filled 2012-11-13: qty 6.7

## 2012-11-13 MED ORDER — HYDROCOD POLST-CHLORPHEN POLST 10-8 MG/5ML PO LQCR: 5.0000 mL | Freq: Two times a day (BID) | ORAL | Status: DC

## 2012-11-13 MED ORDER — HYDROCOD POLST-CHLORPHEN POLST 10-8 MG/5ML PO LQCR
5.0000 mL | Freq: Once | ORAL | Status: AC
  Administered 2012-11-13: 5 mL via ORAL
  Filled 2012-11-13: qty 5

## 2012-11-13 NOTE — ED Provider Notes (Signed)
CSN: [id number]     Arrival date & time 11/13/12  2017 History   First MD Initiated Contact with Patient 11/13/12 2047     This chart was scribed for non-physician practitioner, Cherrie Distance PA-C, working with Laray Anger, DO by Arlan Organ, ED Scribe. This patient was seen in room WTR5/WTR5 and the patient's care was started at 8:48 PM.    Chief Complaint  Patient presents with  . Generalized Body Aches  . Cough   The history is provided by the patient. No language interpreter was used.   HPI Comments: Ellinor Test is a 22 y.o. female who presents to the Emergency Department complaining of generalized body aches that started about 2 weeks ago. Pt states it started off as common cold symptoms such as nasal congestion, HA, coughing, and rib soreness. Pt states she is also experiencing associated urinary frequency and hemoptysis. Pt also reports having some SOB at night time, along with some vaginal discharge. Pt denies emesis, vaginal bleeding, constipation. Pt denies dysuria.  Past Medical History  Diagnosis Date  . Headache(784.0)   . UTI (urinary tract infection)    Past Surgical History  Procedure Laterality Date  . Cesarean section     Family History  Problem Relation Age of Onset  . Anesthesia problems Neg Hx   . Hypotension Neg Hx   . Malignant hyperthermia Neg Hx   . Pseudochol deficiency Neg Hx    History  Substance Use Topics  . Smoking status: Current Every Day Smoker -- 0.10 packs/day for 4 years    Types: Cigarettes    Last Attempt to Quit: 02/26/2011  . Smokeless tobacco: Never Used  . Alcohol Use: No   OB History   Grav Para Term Preterm Abortions TAB SAB Ect Mult Living   1              Review of Systems  Respiratory: Positive for chest tightness and shortness of breath.   Gastrointestinal: Positive for nausea. Negative for vomiting.  Genitourinary: Negative for dysuria.  All other systems reviewed and are negative.    Allergies  Review  of patient's allergies indicates no known allergies.  Home Medications   Current Outpatient Rx  Name  Route  Sig  Dispense  Refill  . metroNIDAZOLE (FLAGYL) 500 MG tablet   Oral   Take 1 tablet (500 mg total) by mouth 2 (two) times daily. One po bid x 7 days   14 tablet   0   . oxyCODONE-acetaminophen (PERCOCET/ROXICET) 5-325 MG per tablet   Oral   Take 2 tablets by mouth every 6 (six) hours as needed for pain.   6 tablet   0    BP 122/82  Pulse 84  Temp(Src) 98.4 F (36.9 C) (Oral)  Resp 20  Ht 5\' 6"  (1.676 m)  SpO2 97% Physical Exam  Nursing note and vitals reviewed. Constitutional: She is oriented to person, place, and time. She appears well-developed and well-nourished.  HENT:  Head: Normocephalic and atraumatic.  Right Ear: Tympanic membrane normal.  Left Ear: Tympanic membrane normal.  No erythema No bulging Bilateral maxillary sinus pain to palpation Oral mucous moist No posterior  pharynx erythema    Eyes: EOM are normal.  Neck: Normal range of motion.  No cervical lymphopathy   Cardiovascular: Normal rate.   Pulmonary/Chest: Effort normal and breath sounds normal. She exhibits tenderness.  Lung sounds clear bilaterally  Musculoskeletal: Normal range of motion.  Right CVA tenderness  Neurological: She  is alert and oriented to person, place, and time.  Skin: Skin is warm and dry.  Psychiatric: She has a normal mood and affect. Her behavior is normal.    ED Course  Procedures (including critical care time)  DIAGNOSTIC STUDIES: Oxygen Saturation is 97% on RA, normal by my interpretation.    COORDINATION OF CARE: 8:47 PM- Will give albuterol and Tussionex. Will order chest x-ray. Discussed treatment plan with pt at bedside and pt agreed to plan.     Labs Review Labs Reviewed - No data to display Imaging Review No results found. Results for orders placed during the hospital encounter of 11/13/12  URINALYSIS, ROUTINE W REFLEX MICROSCOPIC      Result  Value Range   Color, Urine YELLOW  YELLOW   APPearance CLOUDY (*) CLEAR   Specific Gravity, Urine 1.024  1.005 - 1.030   pH 6.0  5.0 - 8.0   Glucose, UA NEGATIVE  NEGATIVE mg/dL   Hgb urine dipstick NEGATIVE  NEGATIVE   Bilirubin Urine NEGATIVE  NEGATIVE   Ketones, ur NEGATIVE  NEGATIVE mg/dL   Protein, ur NEGATIVE  NEGATIVE mg/dL   Urobilinogen, UA 0.2  0.0 - 1.0 mg/dL   Nitrite POSITIVE (*) NEGATIVE   Leukocytes, UA SMALL (*) NEGATIVE  GLUCOSE, CAPILLARY      Result Value Range   Glucose-Capillary 108 (*) 70 - 99 mg/dL  URINE MICROSCOPIC-ADD ON      Result Value Range   Squamous Epithelial / LPF MANY (*) RARE   WBC, UA 7-10  <3 WBC/hpf   Bacteria, UA MANY (*) RARE  POCT PREGNANCY, URINE      Result Value Range   Preg Test, Ur NEGATIVE  NEGATIVE   Dg Chest 2 View  11/13/2012   CLINICAL DATA:  Cough  EXAM: CHEST  2 VIEW  COMPARISON:  06/12/2011  FINDINGS: The heart size and mediastinal contours are within normal limits. Both lungs are clear. The visualized skeletal structures are unremarkable.  IMPRESSION: No active cardiopulmonary disease.   Electronically Signed   By: Alcide Clever M.D.   On: 11/13/2012 21:40   '  MDM  UTI Bronchitis  Patient here with multiple complaints but include chest pain with cough and palpation, cough with green sputum with specks of blood, back pain, urinary frequency and bladder pain.  Exam with non-specific findings, but likely bronchitis and UTI, will place on bactrim, inhaler, cough medication, encouraged to quit smoking and increase fluid intake.   I personally performed the services described in this documentation, which was scribed in my presence. The recorded information has been reviewed and is accurate.        Izola Price Marisue Humble, PA-C 11/13/12 2224

## 2012-11-13 NOTE — ED Notes (Signed)
Pt states that she has had general body pains and cough for just over 2 weeks now.

## 2012-11-14 NOTE — ED Provider Notes (Signed)
Medical screening examination/treatment/procedure(s) were performed by non-physician practitioner and as supervising physician I was immediately available for consultation/collaboration.   Laray Anger, DO 11/14/12 1303

## 2012-11-15 LAB — URINE CULTURE: Colony Count: >=100000

## 2012-11-16 ENCOUNTER — Telehealth (HOSPITAL_COMMUNITY): Payer: Self-pay | Admitting: Emergency Medicine

## 2012-11-16 NOTE — ED Notes (Signed)
Post ED Visit - Positive Culture Follow-up  Culture report reviewed by antimicrobial stewardship pharmacist: []  Wes Dulaney, Pharm.D., BCPS [x]  Celedonio Miyamoto, Pharm.D., BCPS []  Georgina Pillion, Pharm.D., BCPS []  [place], [address].D., BCPS, AAHIVP []  Estella Husk, Pharm.D., BCPS, AAHIVP  Positive urine culture Treated with Sulfa-Trimeth, organism sensitive to the same and no further patient follow-up is required at this time.  Kylie A Holland 11/16/2012, 11:46 AM

## 2012-12-18 ENCOUNTER — Emergency Department (HOSPITAL_COMMUNITY)
Admission: EM | Admit: 2012-12-18 | Discharge: 2012-12-18 | Disposition: A | Discharge status: Home / Self Care | Payer: BC Managed Care – PPO | Attending: Emergency Medicine | Admitting: Emergency Medicine

## 2012-12-18 ENCOUNTER — Encounter (HOSPITAL_COMMUNITY): Payer: Self-pay | Admitting: Emergency Medicine

## 2012-12-18 DIAGNOSIS — Z3202 Encounter for pregnancy test, result negative: Secondary | ICD-10-CM | POA: Insufficient documentation

## 2012-12-18 DIAGNOSIS — F17200 Nicotine dependence, unspecified, uncomplicated: Secondary | ICD-10-CM | POA: Insufficient documentation

## 2012-12-18 DIAGNOSIS — K089 Disorder of teeth and supporting structures, unspecified: Secondary | ICD-10-CM | POA: Insufficient documentation

## 2012-12-18 DIAGNOSIS — K029 Dental caries, unspecified: Secondary | ICD-10-CM

## 2012-12-18 DIAGNOSIS — N390 Urinary tract infection, site not specified: Secondary | ICD-10-CM

## 2012-12-18 DIAGNOSIS — H9209 Otalgia, unspecified ear: Secondary | ICD-10-CM | POA: Insufficient documentation

## 2012-12-18 LAB — PREGNANCY, URINE: Preg Test, Ur: NEGATIVE

## 2012-12-18 MED ORDER — KETOROLAC TROMETHAMINE 60 MG/2ML IM SOLN
60.0000 mg | Freq: Once | INTRAMUSCULAR | Status: AC
  Administered 2012-12-18: 60 mg via INTRAMUSCULAR
  Filled 2012-12-18: qty 2

## 2012-12-18 MED ORDER — CEPHALEXIN 500 MG PO CAPS: 500.0000 mg | ORAL_CAPSULE | Freq: Four times a day (QID) | ORAL | Status: DC

## 2012-12-18 MED ORDER — NAPROXEN 500 MG PO TABS: 500.0000 mg | ORAL_TABLET | Freq: Two times a day (BID) | ORAL | Status: DC

## 2012-12-18 MED ORDER — CEPHALEXIN 500 MG PO CAPS
500.0000 mg | ORAL_CAPSULE | Freq: Once | ORAL | Status: AC
  Administered 2012-12-18: 500 mg via ORAL
  Filled 2012-12-18: qty 1

## 2012-12-18 NOTE — ED Notes (Signed)
Patient c/o right upper tooth pain; right ear pain and states she thinks she may have a UTI.

## 2012-12-18 NOTE — ED Provider Notes (Signed)
CSN: [id number]     Arrival date & time 12/18/12  0122 History   First MD Initiated Contact with Patient 12/18/12 0220     Chief Complaint  Patient presents with  . Dental Pain  . Otalgia  . Urinary Tract Infection   (Consider location/radiation/quality/duration/timing/severity/associated sxs/prior Treatment) HPI Comments: 22 year old female with a history of urinary tract infection diagnosed 2 weeks ago who never got her medication filled and has had persistent difficulty urinating. She also complains of right upper tooth pain which is been present over the last several days, gradually worsening, associated with swelling of her jaw and right ear pain. She has been taking over-the-counter medications without any significant relief. She has had subjective fevers but has not measured a temperature, no nausea vomiting or chills.  Patient is a 22 y.o. female presenting with tooth pain, ear pain, and urinary tract infection. The history is provided by the patient.  Dental Pain Otalgia Urinary Tract Infection    Past Medical History  Diagnosis Date  . Headache(784.0)   . UTI (urinary tract infection)    Past Surgical History  Procedure Laterality Date  . Cesarean section     Family History  Problem Relation Age of Onset  . Anesthesia problems Neg Hx   . Hypotension Neg Hx   . Malignant hyperthermia Neg Hx   . Pseudochol deficiency Neg Hx    History  Substance Use Topics  . Smoking status: Current Every Day Smoker -- 0.10 packs/day for 4 years    Types: Cigarettes    Last Attempt to Quit: 02/26/2011  . Smokeless tobacco: Never Used  . Alcohol Use: No   OB History   Grav Para Term Preterm Abortions TAB SAB Ect Mult Living   1              Review of Systems  HENT: Positive for ear pain.   All other systems reviewed and are negative.    Allergies  Review of patient's allergies indicates no known allergies.  Home Medications   Current Outpatient Rx  Name  Route  Sig   Dispense  Refill  . acetaminophen (TYLENOL) 500 MG tablet   Oral   Take 500 mg by mouth every 6 (six) hours as needed for pain (headache).         Marland Kitchen ibuprofen (ADVIL,MOTRIN) 200 MG tablet   Oral   Take 400 mg by mouth every 6 (six) hours as needed for pain or headache.         . cephALEXin (KEFLEX) 500 MG capsule   Oral   Take 1 capsule (500 mg total) by mouth 4 (four) times daily.   28 capsule   0   . chlorpheniramine-HYDROcodone (TUSSIONEX) 10-8 MG/5ML LQCR   Oral   Take 5 mLs by mouth every 12 (twelve) hours.   140 mL   0   . guaiFENesin (MUCINEX) 600 MG 12 hr tablet   Oral   Take 1,200 mg by mouth 2 (two) times daily as needed for congestion.         . naproxen (NAPROSYN) 500 MG tablet   Oral   Take 1 tablet (500 mg total) by mouth 2 (two) times daily with a meal.   30 tablet   0   . sulfamethoxazole-trimethoprim (BACTRIM DS) 800-160 MG per tablet   Oral   Take 1 tablet by mouth 2 (two) times daily.   20 tablet   0    BP 121/72  Pulse 67  Temp(Src) 98.3 F (36.8 C) (Oral)  Resp 18  Ht 5\' 6"  (1.676 m)  Wt 230 lb (104.327 kg)  BMI 37.14 kg/m2  SpO2 100%  LMP 12/17/2012 Physical Exam  Nursing note and vitals reviewed. Constitutional: She appears well-developed and well-nourished. No distress.  HENT:  Head: Normocephalic and atraumatic.  Mouth/Throat: Oropharynx is clear and moist. No oropharyngeal exudate.  Right upper first molar with deep Iona Hansen, no other signs of dental injury, no signs of periapical abscess, no asymmetry of the jaw  Eyes: Conjunctivae and EOM are normal. Pupils are equal, round, and reactive to light. Right eye exhibits no discharge. Left eye exhibits no discharge. No scleral icterus.  Neck: Normal range of motion. Neck supple. No JVD present. No thyromegaly present.  Cardiovascular: Normal rate, regular rhythm, normal heart sounds and intact distal pulses.  Exam reveals no gallop and no friction rub.   No murmur  heard. Pulmonary/Chest: Effort normal and breath sounds normal. No respiratory distress. She has no wheezes. She has no rales.  Abdominal: Soft. Bowel sounds are normal. She exhibits no distension and no mass. There is no tenderness.  Abdomen is soft and nontender, no CVA tenderness  Musculoskeletal: Normal range of motion. She exhibits no edema and no tenderness.  Lymphadenopathy:    She has no cervical adenopathy.  Neurological: She is alert. Coordination normal.  Skin: Skin is warm and dry. No rash noted. No erythema.  Psychiatric: She has a normal mood and affect. Her behavior is normal.    ED Course  Procedures (including critical care time) Labs Review Labs Reviewed  URINALYSIS, ROUTINE W REFLEX MICROSCOPIC  PREGNANCY, URINE   Imaging Review No results found.  EKG Interpretation   None       MDM   1. Pain due to dental caries   2. UTI (lower urinary tract infection)    The patient has ongoing symptoms, they're mild, she does not appear to have any Ludwig's angina, normal supple neck without lymphadenopathy, trismus or torticollis, no signs of drainable abscess. She does have urinary symptoms and after recent diagnosis we'll treat with Keflex to cover both dental flora as well as urinary flora. Patient has presented very very small amount of urine, we'll treat, discharge home to follow up with family doctor.   Meds given in ED:  Medications  ketorolac (TORADOL) injection 60 mg (not administered)  cephALEXin (KEFLEX) capsule 500 mg (not administered)    New Prescriptions   CEPHALEXIN (KEFLEX) 500 MG CAPSULE    Take 1 capsule (500 mg total) by mouth 4 (four) times daily.   NAPROXEN (NAPROSYN) 500 MG TABLET    Take 1 tablet (500 mg total) by mouth 2 (two) times daily with a meal.        Vida Roller, MD 12/18/12 [phone]

## 2013-03-01 ENCOUNTER — Emergency Department (HOSPITAL_COMMUNITY)
Admission: EM | Admit: 2013-03-01 | Discharge: 2013-03-01 | Disposition: A | Discharge status: Home / Self Care | Payer: BC Managed Care – PPO | Attending: Emergency Medicine | Admitting: Emergency Medicine

## 2013-03-01 ENCOUNTER — Encounter (HOSPITAL_COMMUNITY): Payer: Self-pay | Admitting: Emergency Medicine

## 2013-03-01 DIAGNOSIS — IMO0002 Reserved for concepts with insufficient information to code with codable children: Secondary | ICD-10-CM | POA: Insufficient documentation

## 2013-03-01 DIAGNOSIS — L03113 Cellulitis of right upper limb: Secondary | ICD-10-CM

## 2013-03-01 DIAGNOSIS — R6883 Chills (without fever): Secondary | ICD-10-CM | POA: Insufficient documentation

## 2013-03-01 DIAGNOSIS — Z79899 Other long term (current) drug therapy: Secondary | ICD-10-CM | POA: Insufficient documentation

## 2013-03-01 DIAGNOSIS — Z87440 Personal history of urinary (tract) infections: Secondary | ICD-10-CM | POA: Insufficient documentation

## 2013-03-01 DIAGNOSIS — F17200 Nicotine dependence, unspecified, uncomplicated: Secondary | ICD-10-CM | POA: Insufficient documentation

## 2013-03-01 MED ORDER — HYDROCODONE-ACETAMINOPHEN 5-325 MG PO TABS
1.0000 | ORAL_TABLET | Freq: Once | ORAL | Status: AC
  Administered 2013-03-01: 1 via ORAL
  Filled 2013-03-01: qty 1

## 2013-03-01 MED ORDER — CLINDAMYCIN HCL 300 MG PO CAPS
450.0000 mg | ORAL_CAPSULE | Freq: Once | ORAL | Status: AC
  Administered 2013-03-01: 450 mg via ORAL
  Filled 2013-03-01: qty 1

## 2013-03-01 MED ORDER — DOXYCYCLINE HYCLATE 100 MG PO CAPS: 100.0000 mg | ORAL_CAPSULE | Freq: Two times a day (BID) | ORAL | Status: DC

## 2013-03-01 MED ORDER — HYDROCODONE-ACETAMINOPHEN 5-325 MG PO TABS: 1.0000 | ORAL_TABLET | ORAL | Status: DC | PRN

## 2013-03-01 NOTE — ED Notes (Signed)
Pt complains of right arm swelling and itching for about one week, started in the upper arm and now is on her forearm, pt cannot remember any bug bites or scratches.

## 2013-03-01 NOTE — ED Provider Notes (Signed)
CSN: [id number]     Arrival date & time 03/01/13  0116 History   First MD Initiated Contact with Patient 03/01/13 [phone]     Chief Complaint  Patient presents with  . Arm Pain    right pain    HPI Patient reports 12-24 hours of redness of her right posterior forearm.  She states she has a history of abscess before in the past.  She reports chills without documented fever.  She has an area of redness on her right forearm.  She has had no drainage.  She reports no pain with range of motion of her right elbow.  She denies numbness or weakness in her right hand.  No recent injury or illness.  Symptoms are mild in severity.  Pain is worse by movement and palpation of her right forearm.   Past Medical History  Diagnosis Date  . Headache(784.0)   . UTI (urinary tract infection)    Past Surgical History  Procedure Laterality Date  . Cesarean section     Family History  Problem Relation Age of Onset  . Anesthesia problems Neg Hx   . Hypotension Neg Hx   . Malignant hyperthermia Neg Hx   . Pseudochol deficiency Neg Hx    History  Substance Use Topics  . Smoking status: Current Every Day Smoker -- 0.10 packs/day for 4 years    Types: Cigarettes    Last Attempt to Quit: 02/26/2011  . Smokeless tobacco: Never Used  . Alcohol Use: No   OB History   Grav Para Term Preterm Abortions TAB SAB Ect Mult Living   1              Review of Systems  All other systems reviewed and are negative.    Allergies  Review of patient's allergies indicates no known allergies.  Home Medications   Current Outpatient Rx  Name  Route  Sig  Dispense  Refill  . Polyethylene Glycol 400 (BLINK TEARS) 0.25 % SOLN   Ophthalmic   Apply 1 drop to eye 3 (three) times daily.         Marland Kitchen doxycycline (VIBRAMYCIN) 100 MG capsule   Oral   Take 1 capsule (100 mg total) by mouth 2 (two) times daily.   20 capsule   0   . HYDROcodone-acetaminophen (NORCO/VICODIN) 5-325 MG per tablet   Oral   Take 1 tablet by  mouth every 4 (four) hours as needed for moderate pain.   15 tablet   0    BP 114/75  Pulse 75  Temp(Src) 97.8 F (36.6 C) (Oral)  Resp 20  Ht 5\' 6"  (1.676 m)  Wt 221 lb (100.245 kg)  BMI 35.69 kg/m2  SpO2 96%  LMP 02/09/2013 Physical Exam  Nursing note and vitals reviewed. Constitutional: She is oriented to person, place, and time. She appears well-developed and well-nourished. No distress.  HENT:  Head: Normocephalic and atraumatic.  Eyes: EOM are normal.  Neck: Normal range of motion.  Cardiovascular: Normal rate, regular rhythm and normal heart sounds.   Pulmonary/Chest: Effort normal and breath sounds normal.  Abdominal: Soft. She exhibits no distension. There is no tenderness.  Musculoskeletal: Normal range of motion.  Erythema of her right proximal posterior forearm without fluctuance or drainage.  Full range of motion of her right elbow.  Normal right radial pulse.  Normal supination and pronation of her right hand.  Neurological: She is alert and oriented to person, place, and time.  Skin: Skin is warm  and dry.  Psychiatric: She has a normal mood and affect. Judgment normal.    ED Course  Procedures (including critical care time) Labs Review Labs Reviewed - No data to display Imaging Review No results found.  EKG Interpretation   None       MDM   1. Cellulitis of right forearm    Cellulitis without discernible abscess.  No indication for incision and drainage at this time as there is no palpable fluctuance.  Doubt deep space infection..  Patient will try warm compresses and antibiotics.  She understands return to the ER for new or worsening symptoms.    Hoy Morn, MD 03/01/13 [phone]

## 2013-03-01 NOTE — ED Notes (Signed)
IV access attempted three times, unsuccessful

## 2013-03-01 NOTE — Discharge Instructions (Signed)
Cellulitis Cellulitis is an infection of the skin and the tissue beneath it. The infected area is usually red and tender. Cellulitis occurs most often in the arms and lower legs.  CAUSES  Cellulitis is caused by bacteria that enter the skin through cracks or cuts in the skin. The most common types of bacteria that cause cellulitis are Staphylococcus and Streptococcus. SYMPTOMS   Redness and warmth.  Swelling.  Tenderness or pain.  Fever. DIAGNOSIS  Your caregiver can usually determine what is wrong based on a physical exam. Blood tests may also be done. TREATMENT  Treatment usually involves taking an antibiotic medicine. HOME CARE INSTRUCTIONS   Take your antibiotics as directed. Finish them even if you start to feel better.  Keep the infected arm or leg elevated to reduce swelling.  Apply a warm cloth to the affected area up to 4 times per day to relieve pain.  Only take over-the-counter or prescription medicines for pain, discomfort, or fever as directed by your caregiver.  Keep all follow-up appointments as directed by your caregiver. SEEK MEDICAL CARE IF:   You notice red streaks coming from the infected area.  Your red area gets larger or turns dark in color.  Your bone or joint underneath the infected area becomes painful after the skin has healed.  Your infection returns in the same area or another area.  You notice a swollen bump in the infected area.  You develop new symptoms. SEEK IMMEDIATE MEDICAL CARE IF:   You have a fever.  You feel very sleepy.  You develop vomiting or diarrhea.  You have a general ill feeling (malaise) with muscle aches and pains. MAKE SURE YOU:   Understand these instructions.  Will watch your condition.  Will get help right away if you are not doing well or get worse. Document Released: 11/05/2004 Document Revised: 07/28/2011 Document Reviewed: 04/13/2011 [employer] Patient Information 2014 [employer].  

## 2013-03-01 NOTE — ED Notes (Signed)
Patient is alert and oriented x3.  She is complaining of right arm swelling that started  Last week.  The right arm has notable redness, swelling, heat and pain.  Currently  She rates her pain  8 of 10.

## 2013-03-01 NOTE — ED Notes (Signed)
Bed: WA05 Expected date:  Expected time:  Means of arrival:  Comments: 

## 2013-06-13 ENCOUNTER — Inpatient Hospital Stay (HOSPITAL_COMMUNITY): Payer: BC Managed Care – PPO

## 2013-06-13 ENCOUNTER — Encounter (HOSPITAL_COMMUNITY): Payer: Self-pay | Admitting: *Deleted

## 2013-06-13 ENCOUNTER — Inpatient Hospital Stay (HOSPITAL_COMMUNITY)
Admission: AD | Admit: 2013-06-13 | Discharge: 2013-06-13 | Disposition: A | Discharge status: Home / Self Care | Payer: BC Managed Care – PPO | Source: Ambulatory Visit | Attending: Obstetrics & Gynecology | Admitting: Obstetrics & Gynecology

## 2013-06-13 DIAGNOSIS — N760 Acute vaginitis: Secondary | ICD-10-CM | POA: Insufficient documentation

## 2013-06-13 DIAGNOSIS — O2390 Unspecified genitourinary tract infection in pregnancy, unspecified trimester: Secondary | ICD-10-CM | POA: Insufficient documentation

## 2013-06-13 DIAGNOSIS — B9689 Other specified bacterial agents as the cause of diseases classified elsewhere: Secondary | ICD-10-CM | POA: Diagnosis not present

## 2013-06-13 DIAGNOSIS — R109 Unspecified abdominal pain: Secondary | ICD-10-CM | POA: Insufficient documentation

## 2013-06-13 DIAGNOSIS — A499 Bacterial infection, unspecified: Secondary | ICD-10-CM | POA: Insufficient documentation

## 2013-06-13 DIAGNOSIS — O99330 Smoking (tobacco) complicating pregnancy, unspecified trimester: Secondary | ICD-10-CM | POA: Insufficient documentation

## 2013-06-13 DIAGNOSIS — O26899 Other specified pregnancy related conditions, unspecified trimester: Secondary | ICD-10-CM

## 2013-06-13 DIAGNOSIS — O9989 Other specified diseases and conditions complicating pregnancy, childbirth and the puerperium: Secondary | ICD-10-CM

## 2013-06-13 HISTORY — DX: Unspecified abnormal cytological findings in specimens from vagina: R87.629

## 2013-06-13 LAB — CBC
HCT: 33.4 % — ABNORMAL LOW (ref 36.0–46.0)
Hemoglobin: 11.4 g/dL — ABNORMAL LOW (ref 12.0–15.0)
MCH: 29.5 pg (ref 26.0–34.0)
MCHC: 34.1 g/dL (ref 30.0–36.0)
MCV: 86.3 fL (ref 78.0–100.0)
Platelets: 293 10*3/uL (ref 150–400)
RBC: 3.87 MIL/uL (ref 3.87–5.11)
RDW: 13.4 % (ref 11.5–15.5)
WBC: 6.5 10*3/uL (ref 4.0–10.5)

## 2013-06-13 LAB — URINALYSIS, ROUTINE W REFLEX MICROSCOPIC
Bilirubin Urine: NEGATIVE
Glucose, UA: NEGATIVE mg/dL
Hgb urine dipstick: NEGATIVE
Ketones, ur: NEGATIVE mg/dL
Leukocytes, UA: NEGATIVE
Nitrite: NEGATIVE
Protein, ur: NEGATIVE mg/dL
Specific Gravity, Urine: 1.02 (ref 1.005–1.030)
Urobilinogen, UA: 0.2 mg/dL (ref 0.0–1.0)
pH: 7 (ref 5.0–8.0)

## 2013-06-13 LAB — POCT PREGNANCY, URINE: Preg Test, Ur: POSITIVE — AB

## 2013-06-13 LAB — HCG, QUANTITATIVE, PREGNANCY: hCG, Beta Chain, Quant, S: 55147 m[IU]/mL — ABNORMAL HIGH (ref <5)

## 2013-06-13 LAB — WET PREP, GENITAL
Trich, Wet Prep: NONE SEEN
Yeast Wet Prep HPF POC: NONE SEEN

## 2013-06-13 MED ORDER — METRONIDAZOLE 500 MG PO TABS: 500.0000 mg | ORAL_TABLET | Freq: Two times a day (BID) | ORAL | Status: DC

## 2013-06-13 MED ORDER — HYDROMORPHONE HCL PF 1 MG/ML IJ SOLN
0.5000 mg | Freq: Once | INTRAMUSCULAR | Status: AC
  Administered 2013-06-13: 0.5 mg via INTRAMUSCULAR
  Filled 2013-06-13: qty 1

## 2013-06-13 NOTE — MAU Note (Signed)
Sinus congestion for about a week. Abdominal and low back pain X 2 days and "progressing." Today has been constant. Feels like it is over her previous C/S scar. No vaginal D/C or bleeding. +HPT. Has appointment with [facility] as a new patient.

## 2013-06-13 NOTE — MAU Provider Note (Signed)
Attestation of Attending Supervision of Advanced Practitioner (CNM/NP): Evaluation and management procedures were performed by the Advanced Practitioner under my supervision and collaboration.  I have reviewed the Advanced Practitioner's note and chart, and I agree with the management and plan.  Hoyle Sauer Harraway-Smith 4:42 PM

## 2013-06-13 NOTE — MAU Provider Note (Signed)
History     CSN: [id number]  Arrival date and time: 06/13/13 1212   First Provider Initiated Contact with Patient 06/13/13 1305      Chief Complaint  Patient presents with  . Abdominal Pain  . Back Pain  . Sinus Problem   HPI [name] is a 23 y.o. G2P1001 at [redacted]w[redacted]d who presents to MAU today with complaint of +HPT and abdominal pain x 2 days. The patient states a history of C/S with her last pregnancy test because she was "ruptured x 12 hours" prior to admission. She states that the pain is constant and sharp and shoots up her abdomen. She denies vaginal bleeding or discharge. She has had N/V occasionally without diarrhea or constipation. She is also complaining of occasional dysuria, frequency and urgency of urination. She states temperature of 101F 2-3 days ago resolved with Tylenol.   OB History   Grav Para Term Preterm Abortions TAB SAB Ect Mult Living   2 1 1       1       Past Medical History  Diagnosis Date  . Headache(784.0)   . UTI (urinary tract infection)   . Vaginal Pap smear, abnormal     Past Surgical History  Procedure Laterality Date  . Cesarean section      Family History  Problem Relation Age of Onset  . Anesthesia problems Neg Hx   . Hypotension Neg Hx   . Malignant hyperthermia Neg Hx   . Pseudochol deficiency Neg Hx     History  Substance Use Topics  . Smoking status: Current Every Day Smoker -- 0.10 packs/day for 4 years    Types: Cigarettes    Last Attempt to Quit: 02/26/2011  . Smokeless tobacco: Never Used  . Alcohol Use: No    Allergies: No Known Allergies  Prescriptions prior to admission  Medication Sig Dispense Refill  . doxycycline (VIBRAMYCIN) 100 MG capsule Take 1 capsule (100 mg total) by mouth 2 (two) times daily.  20 capsule  0  . HYDROcodone-acetaminophen (NORCO/VICODIN) 5-325 MG per tablet Take 1 tablet by mouth every 4 (four) hours as needed for moderate pain.  15 tablet  0  . Polyethylene Glycol 400 (BLINK TEARS)  0.25 % SOLN Apply 1 drop to eye 3 (three) times daily.        Review of Systems  Constitutional: Positive for fever. Negative for malaise/fatigue.  Gastrointestinal: Positive for nausea, vomiting and abdominal pain. Negative for diarrhea and constipation.  Genitourinary: Positive for dysuria, urgency and frequency.       Neg - vaginal bleeding, discharge   Physical Exam   Blood pressure 117/67, pulse 88, temperature 97.9 F (36.6 C), temperature source Oral, resp. rate 18, height 5\' 6"  (1.676 m), weight 210 lb (95.255 kg), last menstrual period 04/10/2013.  Physical Exam  Constitutional: She is oriented to person, place, and time. She appears well-developed and well-nourished. No distress.  HENT:  Head: Normocephalic and atraumatic.  Cardiovascular: Normal rate.   Respiratory: Effort normal.  GI: Soft. Bowel sounds are normal. She exhibits no distension and no mass. There is tenderness (mild tenderness to palpation of the lower abdomen more prominent at midline). There is no rebound and no guarding.  Genitourinary: Uterus is enlarged (slightly, exam limited by maternal body habitus) and tender (mild). Cervix exhibits no motion tenderness, no discharge and no friability. Right adnexum displays tenderness. Right adnexum displays no mass. Left adnexum displays tenderness (mild). Left adnexum displays no mass. No bleeding around  the vagina. Vaginal discharge (scant thin, white discharge noted) found.  Neurological: She is alert and oriented to person, place, and time.  Skin: Skin is warm and dry. No erythema.  Psychiatric: She has a normal mood and affect.   Results for orders placed during the hospital encounter of 06/13/13 (from the past 24 hour(s))  CBC     Status: Abnormal   Collection Time    06/13/13  1:15 AM      Result Value Ref Range   WBC 6.5  4.0 - 10.5 K/uL   RBC 3.87  3.87 - 5.11 MIL/uL   Hemoglobin 11.4 (*) 12.0 - 15.0 g/dL   HCT 33.4 (*) 36.0 - 46.0 %   MCV 86.3  78.0 -  100.0 fL   MCH 29.5  26.0 - 34.0 pg   MCHC 34.1  30.0 - 36.0 g/dL   RDW 13.4  11.5 - 15.5 %   Platelets 293  150 - 400 K/uL  HCG, QUANTITATIVE, PREGNANCY     Status: Abnormal   Collection Time    06/13/13  1:15 AM      Result Value Ref Range   hCG, Beta Chain, Quant, [place] [address] (*) <5 mIU/mL  URINALYSIS, ROUTINE W REFLEX MICROSCOPIC     Status: None   Collection Time    06/13/13 12:23 PM      Result Value Ref Range   Color, Urine YELLOW  YELLOW   APPearance CLEAR  CLEAR   Specific Gravity, Urine 1.020  1.005 - 1.030   pH 7.0  5.0 - 8.0   Glucose, UA NEGATIVE  NEGATIVE mg/dL   Hgb urine dipstick NEGATIVE  NEGATIVE   Bilirubin Urine NEGATIVE  NEGATIVE   Ketones, ur NEGATIVE  NEGATIVE mg/dL   Protein, ur NEGATIVE  NEGATIVE mg/dL   Urobilinogen, UA 0.2  0.0 - 1.0 mg/dL   Nitrite NEGATIVE  NEGATIVE   Leukocytes, UA NEGATIVE  NEGATIVE  POCT PREGNANCY, URINE     Status: Abnormal   Collection Time    06/13/13 12:30 PM      Result Value Ref Range   Preg Test, Ur POSITIVE (*) NEGATIVE  WET PREP, GENITAL     Status: Abnormal   Collection Time    06/13/13  1:28 PM      Result Value Ref Range   Yeast Wet Prep HPF POC NONE SEEN  NONE SEEN   Trich, Wet Prep NONE SEEN  NONE SEEN   Clue Cells Wet Prep HPF POC FEW (*) NONE SEEN   WBC, Wet Prep HPF POC FEW (*) NONE SEEN   US Ob Comp Less 14 Wks  06/13/2013   CLINICAL DATA:  Pain.  EXAM: OBSTETRIC <14 WK [place] AND TRANSVAGINAL OB US  TECHNIQUE: Both transabdominal and transvaginal ultrasound examinations were performed for complete evaluation of the gestation as well as the maternal uterus, adnexal regions, and pelvic cul-de-sac. Transvaginal technique was performed to assess early pregnancy.  COMPARISON:  None.  FINDINGS: Intrauterine gestational sac: Visualized/normal in shape.  Yolk sac:  Visualized  Embryo:  Visualized  Cardiac Activity: Visualized  Heart Rate:  154 bpm  MSD:    mm    w     d  CRL:   14.3  mm   7 w 6 d                  [place] EDC:  01/24/2014  Maternal uterus/adnexae: No subchorionic hemorrhage. Trace free fluid. No adnexal masses.  IMPRESSION: Seven  week 6 day intrauterine pregnancy. Fetal heart rate 154 beats per min. No acute maternal findings.   Electronically Signed   By: Rolm Baptise M.D.   On: 06/13/2013 14:30   US Ob Transvaginal  06/13/2013   CLINICAL DATA:  Pain.  EXAM: OBSTETRIC <14 WK [place] AND TRANSVAGINAL OB US  TECHNIQUE: Both transabdominal and transvaginal ultrasound examinations were performed for complete evaluation of the gestation as well as the maternal uterus, adnexal regions, and pelvic cul-de-sac. Transvaginal technique was performed to assess early pregnancy.  COMPARISON:  None.  FINDINGS: Intrauterine gestational sac: Visualized/normal in shape.  Yolk sac:  Visualized  Embryo:  Visualized  Cardiac Activity: Visualized  Heart Rate:  154 bpm  MSD:    mm    w     d  CRL:   14.3  mm   7 w 6 d                  [place] EDC: 01/24/2014  Maternal uterus/adnexae: No subchorionic hemorrhage. Trace free fluid. No adnexal masses.  IMPRESSION: Seven week 6 day intrauterine pregnancy. Fetal heart rate 154 beats per min. No acute maternal findings.   Electronically Signed   By: Rolm Baptise M.D.   On: 06/13/2013 14:30    MAU Course  Procedures None  MDM +UPT UA, wet prep, GC/CHlamydia, CBC, quant hCG and [place] today Blood type is B+ from previous visit in Epic UA shows no signs of infection or dehydration  Assessment and Plan  A: [facility] at [redacted]w[redacted]d with normal cardiac activity Bacterial vaginosis Abdominal pain in pregnancy  P: Discharge home Rx for Flagyl sent to patient's pharmacy Pregnancy confirmation letter given Patient advised to keep appointment with [facility] to start prenatal care as scheduled First trimester warning signs dicussed Patient may return to MAU as needed or if her condition were to change or worsen  Farris Has, Annette-C  06/13/2013, 2:34 PM

## 2013-06-13 NOTE — Discharge Instructions (Signed)
Pregnancy, First Trimester The first trimester is the first 3 months your baby is growing inside you. It is important to follow your doctor's instructions. HOME CARE   Do not smoke.  Do not drink alcohol.  Only take medicine as told by your doctor.  Exercise.  Eat healthy foods. Eat regular, well-balanced meals.  You can have sex (intercourse) if there are no other problems with the pregnancy.  Things that help with morning sickness:  Eat soda crackers before getting up in the morning.  Eat 4 to 5 small meals rather than 3 large meals.  Drink liquids between meals, not during meals.  Go to all appointments as told.  Take all vitamins or supplements as told by your doctor. GET HELP RIGHT AWAY IF:   You develop a fever.  You have a bad smelling fluid that is leaking from your vagina.  There is bleeding from the vagina.  You develop severe belly (abdominal) or back pain.  You throw up (vomit) blood. It may look like coffee grounds.  You lose more than 2 pounds in a week.  You gain 5 pounds or more in a week.  You gain more than 2 pounds in a week and you see puffiness (swelling) in your feet, ankles, or legs.  You have severe dizziness or pass out (faint).  You are around people who have [place] measles, chickenpox, or fifth disease.  You have a headache, watery poop (diarrhea), pain with peeing (urinating), or cannot breath right. Document Released: 07/15/2007 Document Revised: 04/20/2011 Document Reviewed: 07/15/2007 [facility] Patient Information 2014 [place], [place].

## 2013-06-14 LAB — GC/CHLAMYDIA PROBE AMP
CT Probe RNA: NEGATIVE
GC Probe RNA: NEGATIVE

## 2013-06-21 ENCOUNTER — Encounter (HOSPITAL_COMMUNITY): Payer: Self-pay | Admitting: Emergency Medicine

## 2013-06-21 ENCOUNTER — Emergency Department (INDEPENDENT_AMBULATORY_CARE_PROVIDER_SITE_OTHER)
Admission: EM | Admit: 2013-06-21 | Discharge: 2013-06-21 | Disposition: A | Discharge status: Home / Self Care | Payer: BC Managed Care – PPO | Source: Home / Self Care | Attending: Emergency Medicine | Admitting: Emergency Medicine

## 2013-06-21 DIAGNOSIS — T148 Other injury of unspecified body region: Secondary | ICD-10-CM

## 2013-06-21 DIAGNOSIS — K0401 Reversible pulpitis: Secondary | ICD-10-CM

## 2013-06-21 DIAGNOSIS — W57XXXA Bitten or stung by nonvenomous insect and other nonvenomous arthropods, initial encounter: Secondary | ICD-10-CM

## 2013-06-21 MED ORDER — AMOXICILLIN 500 MG PO CAPS: 500.0000 mg | ORAL_CAPSULE | Freq: Three times a day (TID) | ORAL | Status: DC

## 2013-06-21 NOTE — ED Notes (Signed)
C/o hives all over body States she thinks it bed bug bites States area is itchy States she does have some dental pain  Right side of mouth is painful No appt with dentist yet;would like a referral

## 2013-06-21 NOTE — Discharge Instructions (Signed)
For bites use Calamine lotion and Benadryl 25 mg capsules, 1 every 4 hours.   Bedbugs Bedbugs are tiny bugs that live in and around beds. During the day, they hide in mattresses and other places near beds. They come out at night and bite people lying in bed. They need blood to live and grow. Bedbugs can be found in beds anywhere. Usually, they are found in places where many people come and go (hotels, shelters, hospitals). It does not matter whether the place is dirty or clean. Getting bitten by bedbugs rarely causes a medical problem. The biggest problem can be getting rid of them. This often takes the work of a Financial risk analyst. CAUSES  Less use of pesticides. Bedbugs were common before the 1950s. Then, strong pesticides such as DDT nearly wiped them out. Today, these pesticides are not used because they harm the environment and can cause health problems.  More travel. Besides mattresses, bedbugs can also live in clothing and luggage. They can come along as people travel from place to place. Bedbugs are more common in certain parts of the world. When people travel to those areas, the bugs can come home with them.  Presence of birds and bats. Bedbugs often infest birds and bats. If you have these animals in or near your home, bedbugs may infest your house, too. SYMPTOMS It does not hurt to be bitten by a bedbug. You will probably not wake up when you are bitten. Bedbugs usually bite areas of the skin that are not covered. Symptoms may show when you wake up, or they may take a day or more to show up. Symptoms may include:  Small red bumps on the skin. These might be lined up in a row or clustered in a group.  A darker red dot in the middle of red bumps.  Blisters on the skin. There may be swelling and very bad itching. These may be signs of an allergic reaction. This does not happen often. DIAGNOSIS Bedbug bites might look and feel like other types of insect bites. The bugs do not stay on  the body like ticks or lice. They bite, drop off, and crawl away to hide. Your caregiver will probably:  Ask about your symptoms.  Ask about your recent activities and travel.  Check your skin for bedbug bites.  Ask you to check at home for signs of bedbugs. You should look for:  Spots or stains on the bed or nearby. This could be from bedbugs that were crushed or from their eggs or waste.  Bedbugs themselves. They are reddish-brown, oval, and flat. They do not fly. They are about the size of an apple seed.  Places to look for bedbugs include:  Beds. Check mattresses, headboards, box springs, and bed frames.  On drapes and curtains near the bed.  Under carpeting in the bedroom.  Behind electrical outlets.  Behind any wallpaper that is peeling.  Inside luggage. TREATMENT Most bedbug bites do not need treatment. They usually go away on their own in a few days. The bites are not dangerous. However, treatment may be needed if you have scratched so much that your skin has become infected. You may also need treatment if you are allergic to bedbug bites. Treatment options include:  A drug that stops swelling and itching (corticosteroid). Usually, a cream is rubbed on the skin. If you have a bad rash, you may be given a corticosteroid pill.  Oral antihistamines. These are pills to help control  itching.  Antibiotic medicines. An antibiotic may be prescribed for infected skin. HOME CARE INSTRUCTIONS   Take any medicine prescribed by your caregiver for your bites. Follow the directions carefully.  Consider wearing pajamas with long sleeves and pant legs.  Your bedroom may need to be treated. A pest control expert should make sure the bedbugs are gone. You may need to throw away mattresses or luggage. Ask the pest control expert what you can do to keep the bedbugs from coming back. Common suggestions include:  Putting a plastic cover over your mattress.  Washing and drying your  clothes and bedding in hot water and a hot dryer. The temperature should be hotter than 120 F (48.9 C). Bedbugs are killed by high temperatures.  Vacuuming carefully all around your bed. Vacuum in all cracks and crevices where the bugs might hide. Do this often.  Carefully checking all used furniture, bedding, or clothes that you bring into your house.  Eliminating bird nests and bat roosts.  If you get bedbug bites when traveling, check all your possessions carefully before bringing them into your house. If you find any bugs on clothes or in your luggage, consider throwing those items away. SEEK MEDICAL CARE IF:  You have red bug bites that keep coming back.  You have red bug bites that itch badly.  You have bug bites that cause a skin rash.  You have scratch marks that are red and sore. SEEK IMMEDIATE MEDICAL CARE IF: You have a fever. Document Released: 02/28/2010 Document Revised: 04/20/2011 Document Reviewed: 02/28/2010 [facility] Patient Information 2014 [place], [place].   Look up the [facility] for free dental clinics. undoomedical.com.asp  Get there early and be prepared to wait. [employer] and [employer] have Copywriter, advertising schools that provide low cost routine dental care.   Other resources: [facility] [place], [place] [phone]  Patients with Medicaid: [address] Phone:  [phone]                                                  Phone:  [phone]  If unable to pay or uninsured, contact:  [facility] or [facility]. to become qualified for the adult dental clinic.  No matter what dental problem you have, it will not get better unless you get good dental care.  If the tooth is not taken care of, your symptoms will come back in  time and you will be visiting [place] again in the [place] with a bad toothache.  So, see your dentist as soon as possible.  If you don't have a dentist, we can give you a list of dentists.  Sometimes the most cost effective treatment is removal of the tooth.  This can be done very inexpensively through one of the low cost Dance movement psychotherapist such as the facility on [facility] in [place] [phone]).  The downside to this is that you  will have one less tooth and this can effect your ability to chew.  Some other things that can be done for a dental infection include the following:   Rinse your mouth out with hot salt water (1/2 tsp of table salt and a pinch of baking soda in 8 oz of hot water).  You can do this every 2 or 3 hours.  Avoid cold foods, beverages, and cold air.  This will make your symptoms worse.  Sleep with your head elevated.  Sleeping flat will cause your gums and oral tissues to swell and make them hurt more.  You can sleep on several pillows.  Even better is to sleep in a recliner with your head higher than your heart.  For mild to moderate pain, you can take Tylenol, ibuprofen, or Aleve.  External application of heat by a heating pad, hot water bottle, or hot wet towel can help with pain and speed healing.  You can do this every 2 to 3 hours. Do not fall asleep on a heating pad since this can cause a burn.   Go to www.goodrx.com to look up your medications. This will give you a list of where you can find your prescriptions at the most affordable prices.   RESOURCE GUIDE  Dental Problems  Look up SuperiorMarketers.be.asp for a schedule of the [facility]'s free dental clinics called [place]. They have clinics all around [place]. Get there early and be prepared to wait.   [facility] [address]  [place], [place] [address] [phone]  [facility] [place], [place] [phone]  Patients with Medicaid: [facility] [id number] Phone:  [phone]                                                  Phone:  [phone]  If unable to pay or uninsured, contact:  [facility] or [facility]. to become qualified for the adult dental clinic.

## 2013-06-21 NOTE — ED Provider Notes (Signed)
  Chief Complaint    Chief Complaint  Patient presents with  . Urticaria    History of Present Illness      Annette Conway is a 23 year old female who is [redacted] weeks pregnant. She spent this past weekend at a friend's house. When she awoke she was covered with the dog bites on her arms, back, buttocks, and legs. The lesions are very itchy. Mother appear to be infected. She's had no difficulty breathing, wheezing, or swelling of the lips, tongue, throat.  She also has a right, upper, first molar that broke off and now has gotten infected. History painful. She doesn't have an appointment with a dentist yet.  Review of Systems   Other than as noted above, the patient denies any of the following symptoms: Systemic:  No fever or chills. ENT:  No nasal congestion, rhinorrhea, sore throat, swelling of lips, tongue or throat. Resp:  No cough, wheezing, or shortness of breath.  Curry    Past medical history, family history, social history, meds, and allergies were reviewed.   Physical Exam     Vital signs:  BP 101/67  Pulse 87  Temp(Src) 98.5 F (36.9 C) (Oral)  SpO2 100%  LMP 04/10/2013 Gen:  Alert, oriented, in no distress. ENT:  Pharynx clear, no intraoral lesions, moist mucous membranes. Her right, upper, first molar is partially broken and decayed. Is very tender to palpation. Lungs:  Clear to auscultation. Skin:  She has multiple erythematous papules on her arms, back, buttocks, and legs.  Assessment    The primary encounter diagnosis was Insect bites. A diagnosis of Pulpitis was also pertinent to this visit.  Since she is pregnant and like to avoid corticosteroids of any kind. Suggested calamine lotion and Benadryl for the bed bug bites. She was given amoxicillin for the infected tooth. Wants to stay away from any strong pain medications. Suggested followup with a dentist as soon as possible.  Plan     1.  Meds:  The following meds were prescribed:   Discharge Medication List  as of 06/21/2013  7:47 PM    START taking these medications   Details  amoxicillin (AMOXIL) 500 MG capsule Take 1 capsule (500 mg total) by mouth 3 (three) times daily., Starting 06/21/2013, Until Discontinued, Normal        2.  Patient Education/Counseling:  The patient was given appropriate handouts, self care instructions, and instructed in symptomatic relief.    3.  Follow up:  The patient was told to follow up here if no better in 3 to 4 days, or sooner if becoming worse in any way, and given some red flag symptoms such as worsening rash, fever, or difficulty breathing which would prompt immediate return.  Follow up here if necessary.      Harden Mo, MD 06/21/13 2127

## 2013-06-23 ENCOUNTER — Ambulatory Visit (INDEPENDENT_AMBULATORY_CARE_PROVIDER_SITE_OTHER): Payer: BC Managed Care – PPO | Admitting: Advanced Practice Midwife

## 2013-06-23 ENCOUNTER — Encounter: Payer: Self-pay | Admitting: Advanced Practice Midwife

## 2013-06-23 VITALS — BP 111/75 | HR 84 | Temp 98.0°F | Wt 212.0 lb

## 2013-06-23 DIAGNOSIS — Z98891 History of uterine scar from previous surgery: Secondary | ICD-10-CM | POA: Insufficient documentation

## 2013-06-23 DIAGNOSIS — Z3480 Encounter for supervision of other normal pregnancy, unspecified trimester: Secondary | ICD-10-CM

## 2013-06-23 DIAGNOSIS — Z3201 Encounter for pregnancy test, result positive: Secondary | ICD-10-CM

## 2013-06-23 LAB — POCT URINALYSIS DIPSTICK
Blood, UA: NEGATIVE
Glucose, UA: NEGATIVE
Ketones, UA: NEGATIVE
Leukocytes, UA: NEGATIVE
Nitrite, UA: NEGATIVE
Spec Grav, UA: 1.01
pH, UA: 7

## 2013-06-23 NOTE — Progress Notes (Signed)
Subjective:    Annette Conway is being seen today for her first obstetrical visit.  This is a planned pregnancy. She is at [redacted]w[redacted]d gestation. Her obstetrical history is significant for obesity. Patient does intend to breast feed. Pregnancy history fully reviewed.  Patient reports she had SROM w/ her last pregnancy and eventually less than 12 hours came to the hospital. She states her MD told her he was busy and she would have to have a c-section. She was never given other options to labor or for augmentation. States she is very interested and excited in the opportunity for VBAC.  The information documented in the HPI was reviewed and verified.  GC/CT negative this pregnancy. Up to date on pap. Constitutional: negative for fatigue and weight loss Respiratory: negative for cough and wheezing Cardiovascular: negative for chest pain, fatigue and palpitations Gastrointestinal: negative for abdominal pain and change in bowel habits Genitourinary:negative Integument/breast: negative for nipple discharge Musculoskeletal:negative for myalgias Neurological: negative for gait problems and tremors Behavioral/Psych: negative for abusive relationship, depression Endocrine: negative for temperature intolerance       Menstrual History: OB History   Grav Para Term Preterm Abortions TAB SAB Ect Mult Living   2 1 1       1       Menarche age: 79  Patient's last menstrual period was 04/10/2013.    Past Medical History  Diagnosis Date  . Headache(784.0)   . UTI (urinary tract infection)   . Vaginal Pap smear, abnormal     Past Surgical History  Procedure Laterality Date  . Cesarean section       (Not in a hospital admission) No Known Allergies  History  Substance Use Topics  . Smoking status: Current Every Day Smoker -- 0.10 packs/day for 4 years    Types: Cigarettes    Last Attempt to Quit: 02/26/2011  . Smokeless tobacco: Never Used  . Alcohol Use: No    Family History  Problem  Relation Age of Onset  . Anesthesia problems Neg Hx   . Hypotension Neg Hx   . Malignant hyperthermia Neg Hx   . Pseudochol deficiency Neg Hx   . Diabetes Mother   . Diabetes Maternal Grandmother   . Diabetes Paternal Grandmother      Review of Systems Constitutional: negative for weight loss Gastrointestinal: negative for vomiting Genitourinary:negative for genital lesions and vaginal discharge and dysuria Musculoskeletal:negative for back pain Behavioral/Psych: negative for abusive relationship, depression, illegal drug usage and tobacco use    Objective:   Filed Vitals:   06/23/13 1101  BP: 111/75  Pulse: 84  Temp: 98 F (36.7 C)   Filed Vitals:   06/23/13 1101  Weight: 212 lb (96.163 kg)      General Appearance:    Alert, cooperative, no distress, appears stated age  Head:    Normocephalic, without obvious abnormality, atraumatic  Eyes:    PERRL, conjunctiva/corneas clear, EOM's intact, fundi    benign, both eyes  Ears:    Normal TM's and external ear canals, both ears  Nose:   Nares normal, septum midline, mucosa normal, no drainage    or sinus tenderness  Throat:   Lips, mucosa, and tongue normal; teeth and gums normal  Neck:   Supple, symmetrical, trachea midline, no adenopathy;    thyroid:  no enlargement/tenderness/nodules; no carotid   bruit or JVD  Back:     Symmetric, no curvature, ROM normal, no CVA tenderness  Lungs:     Clear to auscultation  bilaterally, respirations unlabored  Chest Wall:    No tenderness or deformity   Heart:    Regular rate and rhythm, S1 and S2 normal, no murmur, rub   or gallop  Breast Exam:    No tenderness, masses, or nipple abnormality  Abdomen:     Soft, non-tender, bowel sounds active all four quadrants,    no masses, no organomegaly     Extremities:   Extremities normal, atraumatic, no cyanosis or edema  Pulses:   2+ and symmetric all extremities  Skin:   Skin color, texture, turgor normal, no rashes or lesions  Lymph  nodes:   Cervical, supraclavicular, and axillary nodes normal  Neurologic:   CNII-XII intact, normal strength, sensation and reflexes    throughout      Lab Review Urine pregnancy test Labs reviewed yes Radiologic studies reviewed yes Assessment:    Pregnancy at [redacted]w[redacted]d weeks   Patient Active Problem List   Diagnosis Date Noted  . History of C-section 06/23/2013  Desires VBAC    Plan:      Prenatal vitamins.  Counseling provided regarding continued use of seat belts, cessation of alcohol consumption, smoking or use of illicit drugs; infection precautions i.e., influenza/TDAP immunizations, toxoplasmosis,CMV, parvovirus, listeria and varicella; workplace safety, exercise during pregnancy; routine dental care, safe medications, sexual activity, hot tubs, saunas, pools, travel, caffeine use, fish and methlymercury, potential toxins, hair treatments, varicose veins Weight gain recommendations per IOM guidelines reviewed: underweight/BMI< 18.5--> gain 28 - 40 lbs; normal weight/BMI 18.5 - 24.9--> gain 25 - 35 lbs; overweight/BMI 25 - 29.9--> gain 15 - 25 lbs; obese/BMI >30->gain  11 - 20 lbs Problem list reviewed and updated. FIRST/CF mutation testing/NIPT/QUAD SCREEN/fragile X/Ashkenazi Jewish population testing/Spinal muscular atrophy discussed: plan NV. Role of ultrasound in pregnancy discussed; fetal survey: plan 18-20 wk ROS. Amniocentesis discussed: not indicated. VBAC calculator score: 52% VBAC consent form provided No orders of the defined types were placed in this encounter.   Orders Placed This Encounter  Procedures  . Culture, OB Urine  . Obstetric panel  . HIV antibody  . Hemoglobinopathy evaluation  . Vit D  25 hydroxy (rtn osteoporosis monitoring)  . Varicella zoster antibody, IgG  . POCT urinalysis dipstick    Follow up in 4 weeks. 80% of 40 min visit spent on counseling and coordination of care.   Aariel Ems Roni Bread CNM

## 2013-06-26 ENCOUNTER — Other Ambulatory Visit: Payer: BC Managed Care – PPO

## 2013-06-28 ENCOUNTER — Other Ambulatory Visit: Payer: BC Managed Care – PPO

## 2013-06-29 LAB — OBSTETRIC PANEL
Antibody Screen: NEGATIVE
Basophils Absolute: 0 10*3/uL (ref 0.0–0.1)
Basophils Relative: 0 % (ref 0–1)
Eosinophils Absolute: 0.4 10*3/uL (ref 0.0–0.7)
Eosinophils Relative: 6 % — ABNORMAL HIGH (ref 0–5)
HCT: 34.2 % — ABNORMAL LOW (ref 36.0–46.0)
Hemoglobin: 11.8 g/dL — ABNORMAL LOW (ref 12.0–15.0)
Hepatitis B Surface Ag: NEGATIVE
Lymphocytes Relative: 35 % (ref 12–46)
Lymphs Abs: 2.1 10*3/uL (ref 0.7–4.0)
MCH: 29.7 pg (ref 26.0–34.0)
MCHC: 34.5 g/dL (ref 30.0–36.0)
MCV: 86.1 fL (ref 78.0–100.0)
Monocytes Absolute: 0.4 10*3/uL (ref 0.1–1.0)
Monocytes Relative: 6 % (ref 3–12)
Neutro Abs: 3.1 10*3/uL (ref 1.7–7.7)
Neutrophils Relative %: 53 % (ref 43–77)
Platelets: 309 10*3/uL (ref 150–400)
RBC: 3.97 MIL/uL (ref 3.87–5.11)
RDW: 14 % (ref 11.5–15.5)
Rh Type: POSITIVE
Rubella: 2.15 Index — ABNORMAL HIGH (ref <0.90)
WBC: 5.9 10*3/uL (ref 4.0–10.5)

## 2013-06-29 LAB — VARICELLA ZOSTER ANTIBODY, IGG: Varicella IgG: 2364 Index — ABNORMAL HIGH (ref <135.00)

## 2013-06-29 LAB — HIV ANTIBODY (ROUTINE TESTING W REFLEX): HIV 1&2 Ab, 4th Generation: NONREACTIVE

## 2013-06-29 LAB — VITAMIN D 25 HYDROXY (VIT D DEFICIENCY, FRACTURES): Vit D, 25-Hydroxy: 17 ng/mL — ABNORMAL LOW (ref 30–89)

## 2013-06-30 LAB — HEMOGLOBINOPATHY EVALUATION
Hemoglobin Other: 0 %
Hgb A2 Quant: 2.7 % (ref 2.2–3.2)
Hgb A: 96.3 % — ABNORMAL LOW (ref 96.8–97.8)
Hgb F Quant: 1 % (ref 0.0–2.0)
Hgb S Quant: 0 %

## 2013-07-01 LAB — GLUCOSE TOLERANCE, 1 HOUR (50G) W/O FASTING: Glucose, 1 Hour GTT: 104 mg/dL (ref 70–140)

## 2013-07-21 ENCOUNTER — Encounter: Payer: BC Managed Care – PPO | Admitting: Advanced Practice Midwife

## 2013-07-25 ENCOUNTER — Ambulatory Visit (INDEPENDENT_AMBULATORY_CARE_PROVIDER_SITE_OTHER): Payer: BC Managed Care – PPO | Admitting: Advanced Practice Midwife

## 2013-07-25 VITALS — BP 106/70 | HR 66 | Temp 98.4°F | Wt 209.0 lb

## 2013-07-25 DIAGNOSIS — Z3480 Encounter for supervision of other normal pregnancy, unspecified trimester: Secondary | ICD-10-CM

## 2013-07-25 DIAGNOSIS — B3731 Acute candidiasis of vulva and vagina: Secondary | ICD-10-CM | POA: Insufficient documentation

## 2013-07-25 DIAGNOSIS — B373 Candidiasis of vulva and vagina: Secondary | ICD-10-CM

## 2013-07-25 DIAGNOSIS — B379 Candidiasis, unspecified: Secondary | ICD-10-CM

## 2013-07-25 LAB — POCT URINALYSIS DIPSTICK
Bilirubin, UA: NEGATIVE
Blood, UA: NEGATIVE
Glucose, UA: NEGATIVE
Ketones, UA: NEGATIVE
Leukocytes, UA: NEGATIVE
Nitrite, UA: NEGATIVE
Spec Grav, UA: 1.01
Urobilinogen, UA: NEGATIVE
pH, UA: 7

## 2013-07-25 MED ORDER — OB COMPLETE PETITE 35-5-1-200 MG PO CAPS: 1.0000 | ORAL_CAPSULE | Freq: Every day | ORAL | Status: DC

## 2013-07-25 MED ORDER — FLUCONAZOLE 150 MG PO TABS: 150.0000 mg | ORAL_TABLET | Freq: Every day | ORAL | Status: DC

## 2013-07-25 NOTE — Progress Notes (Signed)
Subjective: Annette Conway is a 23 y.o. at 13 weeks by early ultrasound  Patient denies vaginal leaking of fluid or bleeding, denies contractions.  Reports absent fetal movment.  Patient reports white, clumpy discharge and vaginal itching.  Objective: Filed Vitals:   07/25/13 1359  BP: 106/70  Pulse: 66  Temp: 98.4 F (36.9 C)   160 FHR SP Fundal Height Fetal Position unknown  Assessment: Patient Active Problem List   Diagnosis Date Noted  . Vaginal yeast infection 07/25/2013  . History of C-section 06/23/2013    Plan: Patient to return to clinic in 4 weeks ROS [place] Offer Quad screen Reviewed Vit D supplement Orders Placed This Encounter  Procedures  . POCT urinalysis dipstick   Meds ordered this encounter  Medications  . Prenat-FeCbn-FeAspGl-FA-Omega (OB COMPLETE PETITE) 35-5-1-200 MG CAPS    Sig: Take 1 tablet by mouth daily.    Dispense:  30 capsule    Refill:  12    Order Specific Question:  Supervising Provider    Answer:  Baltazar Najjar A [[id number]]  . fluconazole (DIFLUCAN) 150 MG tablet    Sig: Take 1 tablet (150 mg total) by mouth daily.    Dispense:  1 tablet    Refill:  0    Order Specific Question:  Supervising Provider    Answer:  HARPER, CHARLES A [[id number]]   ROS [place] Reviewed warning signs in pregnancy. Patient to call with concerns PRN. Reviewed triage location.   Gilliam Hawkes Roni Bread CNM

## 2013-07-28 LAB — CULTURE, OB URINE: Colony Count: >=100000

## 2013-08-02 ENCOUNTER — Encounter (HOSPITAL_COMMUNITY): Payer: Self-pay | Admitting: *Deleted

## 2013-08-02 ENCOUNTER — Inpatient Hospital Stay (HOSPITAL_COMMUNITY)
Admission: AD | Admit: 2013-08-02 | Discharge: 2013-08-02 | Disposition: A | Discharge status: Home / Self Care | Payer: BC Managed Care – PPO | Source: Ambulatory Visit | Attending: Obstetrics & Gynecology | Admitting: Obstetrics & Gynecology

## 2013-08-02 DIAGNOSIS — O9989 Other specified diseases and conditions complicating pregnancy, childbirth and the puerperium: Principal | ICD-10-CM

## 2013-08-02 DIAGNOSIS — O99330 Smoking (tobacco) complicating pregnancy, unspecified trimester: Secondary | ICD-10-CM | POA: Insufficient documentation

## 2013-08-02 DIAGNOSIS — H60399 Other infective otitis externa, unspecified ear: Secondary | ICD-10-CM | POA: Insufficient documentation

## 2013-08-02 DIAGNOSIS — O99891 Other specified diseases and conditions complicating pregnancy: Secondary | ICD-10-CM

## 2013-08-02 DIAGNOSIS — H6092 Unspecified otitis externa, left ear: Secondary | ICD-10-CM

## 2013-08-02 LAB — CBC
HCT: 33.2 % — ABNORMAL LOW (ref 36.0–46.0)
Hemoglobin: 11.4 g/dL — ABNORMAL LOW (ref 12.0–15.0)
MCH: 30 pg (ref 26.0–34.0)
MCHC: 34.3 g/dL (ref 30.0–36.0)
MCV: 87.4 fL (ref 78.0–100.0)
Platelets: 273 10*3/uL (ref 150–400)
RBC: 3.8 MIL/uL — ABNORMAL LOW (ref 3.87–5.11)
RDW: 12.9 % (ref 11.5–15.5)
WBC: 7.4 10*3/uL (ref 4.0–10.5)

## 2013-08-02 MED ORDER — NEOMYCIN-POLYMYXIN-HC 3.5-10000-1 OT SOLN: 4.0000 [drp] | Freq: Four times a day (QID) | OTIC | Status: AC

## 2013-08-02 MED ORDER — TRAMADOL HCL 50 MG PO TABS: 50.0000 mg | ORAL_TABLET | Freq: Four times a day (QID) | ORAL | Status: DC | PRN

## 2013-08-02 MED ORDER — NEOMYCIN-POLYMYXIN-HC 3.5-10000-1 OT SUSP
4.0000 [drp] | Freq: Once | OTIC | Status: AC
  Administered 2013-08-02: 4 [drp] via OTIC
  Filled 2013-08-02: qty 10

## 2013-08-02 MED ORDER — OXYCODONE-ACETAMINOPHEN 5-325 MG PO TABS
2.0000 | ORAL_TABLET | Freq: Once | ORAL | Status: AC
  Administered 2013-08-02: 2 via ORAL
  Filled 2013-08-02: qty 2

## 2013-08-02 NOTE — Discharge Instructions (Signed)
Otitis Externa Otitis externa is a bacterial or fungal infection of the outer ear canal. This is the area from the eardrum to the outside of the ear. Otitis externa is sometimes called "swimmer's ear." CAUSES  Possible causes of infection include:  Swimming in dirty water.  Moisture remaining in the ear after swimming or bathing.  Mild injury (trauma) to the ear.  Objects stuck in the ear (foreign body).  Cuts or scrapes (abrasions) on the outside of the ear. SYMPTOMS  The first symptom of infection is often itching in the ear canal. Later signs and symptoms may include swelling and redness of the ear canal, ear pain, and yellowish-white fluid (pus) coming from the ear. The ear pain may be worse when pulling on the earlobe. DIAGNOSIS  Your caregiver will perform a physical exam. A sample of fluid may be taken from the ear and examined for bacteria or fungi. TREATMENT  Antibiotic ear drops are often given for 10 to 14 days. Treatment may also include pain medicine or corticosteroids to reduce itching and swelling. PREVENTION   Keep your ear dry. Use the corner of a towel to absorb water out of the ear canal after swimming or bathing.  Avoid scratching or putting objects inside your ear. This can damage the ear canal or remove the protective wax that lines the canal. This makes it easier for bacteria and fungi to grow.  Avoid swimming in lakes, polluted water, or poorly chlorinated pools.  You may use ear drops made of rubbing alcohol and vinegar after swimming. Combine equal parts of white vinegar and alcohol in a bottle. Put 3 or 4 drops into each ear after swimming. HOME CARE INSTRUCTIONS   Apply antibiotic ear drops to the ear canal as prescribed by your caregiver.  Only take over-the-counter or prescription medicines for pain, discomfort, or fever as directed by your caregiver.  If you have diabetes, follow any additional treatment instructions from your caregiver.  Keep all  follow-up appointments as directed by your caregiver. SEEK MEDICAL CARE IF:   You have a fever.  Your ear is still red, swollen, painful, or draining pus after 3 days.  Your redness, swelling, or pain gets worse.  You have a severe headache.  You have redness, swelling, pain, or tenderness in the area behind your ear. MAKE SURE YOU:   Understand these instructions.  Will watch your condition.  Will get help right away if you are not doing well or get worse. Document Released: 01/26/2005 Document Revised: 04/20/2011 Document Reviewed: 02/12/2011 [facility] Patient Information 2015 [place], [place]. This information is not intended to replace advice given to you by your health care provider. Make sure you discuss any questions you have with your health care provider.

## 2013-08-02 NOTE — MAU Provider Note (Signed)
Chief Complaint: Otalgia  First Provider Initiated Contact with Patient 08/02/13 2238     SUBJECTIVE HPI: Annette Conway is a 23 y.o. G2P1001 at [redacted]w[redacted]d by LMP who presents with severe left ear pain radiating to the left jaw x4 weeks. Pain worsening over the past 2 days. States midwife at [facility] prescribed an antibiotic that would cover ear infection few weeks ago, but patient did not have any relief of symptoms. Has not seen primary care ENT for this problem. Denies fever, chills, obvious decay or breakage of teeth, swelling or purulent drainage from gums, drainage from the ear, trauma to ear or hearing loss. Taking Tylenol without relief of pain. Took a family member's narcotic pain medication today with no relief as well. States pain is so severe that she cannot function in school.  No pregnancy complaints.   Past Medical History  Diagnosis Date  . Headache(784.0)   . UTI (urinary tract infection)   . Vaginal Pap smear, abnormal    OB History  Gravida Para Term Preterm AB SAB TAB Ectopic Multiple Living  2 1 1       1     # Outcome Date GA Lbr Len/2nd Weight Sex Delivery Anes PTL Lv  2 CUR           1 TRM     M LTCS   Y     Past Surgical History  Procedure Laterality Date  . Cesarean section     History   Social History  . Marital Status: Single    Spouse Name: N/A    Number of Children: N/A  . Years of Education: N/A   Occupational History  . Not on file.   Social History Main Topics  . Smoking status: Current Every Day Smoker -- 0.10 packs/day for 4 years    Types: Cigarettes    Last Attempt to Quit: 02/26/2011  . Smokeless tobacco: Never Used  . Alcohol Use: No  . Drug Use: Yes    Special: Marijuana     Comment: trying to stop  . Sexual Activity: Yes   Other Topics Concern  . Not on file   Social History Narrative  . No narrative on file   No current facility-administered medications on file prior to encounter.   Current Outpatient Prescriptions on File  Prior to Encounter  Medication Sig Dispense Refill  . Prenatal Vit-Fe Fumarate-FA (PRENATAL MULTIVITAMIN) TABS tablet Take 1 tablet by mouth daily at 12 noon.       No Known Allergies  ROS: Pertinent items in HPI  OBJECTIVE Blood pressure 115/70, pulse 70, temperature 98.7 F (37.1 C), temperature source Oral, resp. rate 18, height 5\' 6"  (1.676 m), weight 94.802 kg (209 lb), last menstrual period 04/10/2013, SpO2 100.00%. GENERAL: Well-developed, well-nourished female in moderate distress.  HEENT: Normocephalic. Mastoid process nontender.  Ear: Erythema and severe tenderness of lower third of left ear canal. Slight erythema of lower quarter of tympanic membrane. Small effusion. No bulging or opacity of tympanic membrane. Exam of right ear normal. No tenderness.  Mouth: Normal dentition of left upper and lower molars except for small? Filling on lower back molar. No tenderness, erythema or drainage from gums. Left jaw mildly tender to palpation. Throat without erythema or exudate. HEART: normal rate RESP: normal effort EXTREMITIES: Nontender, no edema NEURO: Alert and oriented Fetal heart rate 168 by Doppler.  LAB RESULTS Results for orders placed during the hospital encounter of 08/02/13 (from the past 24 hour(s))  CBC  Status: Abnormal   Collection Time    08/02/13 11:05 PM      Result Value Ref Range   WBC 7.4  4.0 - 10.5 K/uL   RBC 3.80 (*) 3.87 - 5.11 MIL/uL   Hemoglobin 11.4 (*) 12.0 - 15.0 g/dL   HCT 33.2 (*) 36.0 - 46.0 %   MCV 87.4  78.0 - 100.0 fL   MCH 30.0  26.0 - 34.0 pg   MCHC 34.3  30.0 - 36.0 g/dL   RDW 12.9  11.5 - 15.5 %   Platelets 273  150 - 400 K/uL    IMAGING No results found.  MAU COURSE Percocet given.  Discussed patient's symptoms and exam with Dr. Delsa Sale. Cortisporin started in MAU. CBC ordered due to patient's severe pain.  ASSESSMENT 1. Otitis externa, left   2. Other current maternal conditions classifiable elsewhere, antepartum      PLAN Discharge home in stable condition per consult with Dr. Delsa Sale Follow-up Information   Follow up with [facility]. (call office in the morning for ENT referral and , As scheduled)    Contact information:   [address] [place] [address] [phone]      Follow up with [facility]. (As needed in emergencies not related to pregnancy)    Contact information:   [place] [address]        Medication List         acetaminophen 325 MG tablet  Commonly known as:  TYLENOL  Take 650 mg by mouth every 6 (six) hours as needed for moderate pain.     fluconazole 150 MG tablet  Commonly known as:  DIFLUCAN  Take 1 tablet (150 mg total) by mouth daily.     neomycin-polymyxin-hydrocortisone otic solution  Commonly known as:  CORTISPORIN  Place 4 drops into the left ear 4 (four) times daily.     prenatal multivitamin Tabs tablet  Take 1 tablet by mouth daily at 12 noon.     traMADol 50 MG tablet  Commonly known as:  ULTRAM  Take 1-2 tablets (50-100 mg total) by mouth every 6 (six) hours as needed for severe pain.       Manya Silvas, CNM 08/03/2013  12:00 AM

## 2013-08-02 NOTE — MAU Note (Signed)
Pt reports she has had pain in her left ear for the last 4 weeks, states for the last 2 days she has had pain in the whole left side of her face, her throat, and the back of her head on that side. Denies fever but states she has been taking tylenol a lot.

## 2013-08-17 ENCOUNTER — Encounter: Payer: Self-pay | Admitting: *Deleted

## 2013-08-23 ENCOUNTER — Telehealth: Payer: Self-pay | Admitting: *Deleted

## 2013-08-23 NOTE — Telephone Encounter (Signed)
Patient states she is at 18 weeks and had been feeling good movement, but for the last 3-4 days has not.  2:00 call to patient VM- usually at 18-20 weeks can start to feel movement- could be that the fetus has changed positions and is in more of the back of the uterus. Reviewed a couple of things to try to see if she can get the baby to move again- offered appointment if she needs reassurance. Requested patient call.

## 2013-08-24 ENCOUNTER — Encounter (HOSPITAL_COMMUNITY): Payer: Self-pay | Admitting: General Practice

## 2013-08-24 ENCOUNTER — Inpatient Hospital Stay (HOSPITAL_COMMUNITY)
Admission: AD | Admit: 2013-08-24 | Discharge: 2013-08-24 | Disposition: A | Discharge status: Home / Self Care | Payer: BC Managed Care – PPO | Source: Ambulatory Visit | Attending: Obstetrics | Admitting: Obstetrics

## 2013-08-24 ENCOUNTER — Inpatient Hospital Stay (HOSPITAL_COMMUNITY): Payer: BC Managed Care – PPO

## 2013-08-24 ENCOUNTER — Other Ambulatory Visit: Payer: Self-pay | Admitting: *Deleted

## 2013-08-24 DIAGNOSIS — B373 Candidiasis of vulva and vagina: Secondary | ICD-10-CM

## 2013-08-24 DIAGNOSIS — O99891 Other specified diseases and conditions complicating pregnancy: Secondary | ICD-10-CM | POA: Diagnosis not present

## 2013-08-24 DIAGNOSIS — O99330 Smoking (tobacco) complicating pregnancy, unspecified trimester: Secondary | ICD-10-CM | POA: Diagnosis not present

## 2013-08-24 DIAGNOSIS — O368190 Decreased fetal movements, unspecified trimester, not applicable or unspecified: Secondary | ICD-10-CM | POA: Insufficient documentation

## 2013-08-24 DIAGNOSIS — Z98891 History of uterine scar from previous surgery: Secondary | ICD-10-CM

## 2013-08-24 DIAGNOSIS — R109 Unspecified abdominal pain: Secondary | ICD-10-CM | POA: Diagnosis not present

## 2013-08-24 DIAGNOSIS — M549 Dorsalgia, unspecified: Secondary | ICD-10-CM | POA: Insufficient documentation

## 2013-08-24 DIAGNOSIS — Z3482 Encounter for supervision of other normal pregnancy, second trimester: Secondary | ICD-10-CM

## 2013-08-24 DIAGNOSIS — O9989 Other specified diseases and conditions complicating pregnancy, childbirth and the puerperium: Principal | ICD-10-CM

## 2013-08-24 DIAGNOSIS — O26899 Other specified pregnancy related conditions, unspecified trimester: Secondary | ICD-10-CM

## 2013-08-24 DIAGNOSIS — B3731 Acute candidiasis of vulva and vagina: Secondary | ICD-10-CM

## 2013-08-24 LAB — URINALYSIS, ROUTINE W REFLEX MICROSCOPIC
Bilirubin Urine: NEGATIVE
Glucose, UA: NEGATIVE mg/dL
Hgb urine dipstick: NEGATIVE
Ketones, ur: 15 mg/dL — AB
Leukocytes, UA: NEGATIVE
Nitrite: NEGATIVE
Protein, ur: NEGATIVE mg/dL
Specific Gravity, Urine: 1.02 (ref 1.005–1.030)
Urobilinogen, UA: 2 mg/dL — ABNORMAL HIGH (ref 0.0–1.0)
pH: 6.5 (ref 5.0–8.0)

## 2013-08-24 MED ORDER — CYCLOBENZAPRINE HCL 10 MG PO TABS: 10.0000 mg | ORAL_TABLET | Freq: Three times a day (TID) | ORAL | Status: DC | PRN

## 2013-08-24 MED ORDER — OXYCODONE-ACETAMINOPHEN 5-325 MG PO TABS: 1.0000 | ORAL_TABLET | Freq: Four times a day (QID) | ORAL | Status: DC | PRN

## 2013-08-24 MED ORDER — CYCLOBENZAPRINE HCL 10 MG PO TABS
10.0000 mg | ORAL_TABLET | Freq: Once | ORAL | Status: AC
  Administered 2013-08-24: 10 mg via ORAL
  Filled 2013-08-24: qty 1

## 2013-08-24 NOTE — MAU Note (Signed)
Pt reports she has had lower abd pain x 4 days and back pain. Reported decreased fetal movement. Stated she had felt baby move ever since she conceived and had not felt it for about a week.

## 2013-08-24 NOTE — Discharge Instructions (Signed)

## 2013-08-24 NOTE — MAU Provider Note (Signed)
History     CSN: [id number]  Arrival date and time: 08/24/13 1349   First Provider Initiated Contact with Patient 08/24/13 1434      Chief Complaint  Patient presents with  . Abdominal Pain  . Decreased Fetal Movement  . Back Pain   Abdominal Pain Pertinent negatives include no dysuria or hematuria.  Back Pain Associated symptoms include abdominal pain (lower pelvic). Pertinent negatives include no dysuria.    Pt is a 23 yo G2P1001 at [redacted]w[redacted]d wks IUP here with report of lower abd pain x 4 days and back pain. Abdominal pain is described as a constant burning pain at the bottom of abdomen.  Back pain is also described as a burning sensation.  Denies UTI symptoms or abnormal vaginal discharge.  Reports decreased fetal movement. Stated she had felt baby move ever since she conceived and had not felt it for about a week.  Pt also recently had three teeth pulled; on Amoxicillin.     Past Medical History  Diagnosis Date  . Headache(784.0)   . UTI (urinary tract infection)   . Vaginal Pap smear, abnormal     Past Surgical History  Procedure Laterality Date  . Cesarean section      Family History  Problem Relation Age of Onset  . Anesthesia problems Neg Hx   . Hypotension Neg Hx   . Malignant hyperthermia Neg Hx   . Pseudochol deficiency Neg Hx   . Diabetes Mother   . Diabetes Maternal Grandmother   . Diabetes Paternal Grandmother     History  Substance Use Topics  . Smoking status: Current Every Day Smoker -- 0.10 packs/day for 4 years    Types: Cigarettes    Last Attempt to Quit: 02/26/2011  . Smokeless tobacco: Never Used  . Alcohol Use: No    Allergies: No Known Allergies  Prescriptions prior to admission  Medication Sig Dispense Refill  . acetaminophen (TYLENOL) 500 MG tablet Take 1,000 mg by mouth every 6 (six) hours as needed for mild pain.      Marland Kitchen amoxicillin (AMOXIL) 500 MG tablet Take 500 mg by mouth daily.      Marland Kitchen oxyCODONE-acetaminophen  (PERCOCET/ROXICET) 5-325 MG per tablet Take 2 tablets by mouth every 6 (six) hours as needed for severe pain.      . Prenatal Vit-Fe Fumarate-FA (PRENATAL MULTIVITAMIN) TABS tablet Take 1 tablet by mouth daily at 12 noon.      . traMADol (ULTRAM) 50 MG tablet Take 1-2 tablets (50-100 mg total) by mouth every 6 (six) hours as needed for severe pain.  30 tablet  0    Review of Systems  Gastrointestinal: Positive for abdominal pain (lower pelvic).  Genitourinary: Negative for dysuria and hematuria.  Musculoskeletal: Positive for back pain.   Physical Exam   Blood pressure 117/70, pulse 82, temperature 97.9 F (36.6 C), temperature source Oral, resp. rate 18, height 5\' 6"  (1.676 m), weight 92.625 kg (204 lb 3.2 oz), last menstrual period 04/10/2013.  Physical Exam  Constitutional: She is oriented to person, place, and time. She appears well-developed and well-nourished. No distress.  HENT:  Head: Normocephalic.  Neck: Normal range of motion. Neck supple.  Cardiovascular: Normal rate, regular rhythm and normal heart sounds.   Respiratory: Effort normal and breath sounds normal.  GI: Soft. There is no tenderness.  Genitourinary: No bleeding around the vagina. Vaginal discharge (mucusy) found.  Neurological: She is alert and oriented to person, place, and time.  Skin: Skin is warm  and dry.     Cervix - closed  MAU Course  Procedures  Flexeril 10 mg PO given > pt reports decrease in pelvic pain and back pain.    Assessment and Plan  23 yo G2P1001 at [redacted]w[redacted]d wks IUP Abdominal Pain in Pregnancy - Normal Exam  Plan: Discharge to home Keep scheduled appointment Reviewed pregnancy warning signs  Gwen Pounds 08/24/2013, 2:37 PM

## 2013-08-29 ENCOUNTER — Encounter: Payer: BC Managed Care – PPO | Admitting: Advanced Practice Midwife

## 2013-08-29 ENCOUNTER — Other Ambulatory Visit: Payer: BC Managed Care – PPO

## 2013-08-29 ENCOUNTER — Encounter: Payer: BC Managed Care – PPO | Admitting: Obstetrics

## 2013-08-30 ENCOUNTER — Ambulatory Visit (HOSPITAL_COMMUNITY)
Admission: RE | Admit: 2013-08-30 | Discharge: 2013-08-30 | Disposition: A | Discharge status: Home / Self Care | Payer: BC Managed Care – PPO | Source: Ambulatory Visit | Attending: Advanced Practice Midwife | Admitting: Advanced Practice Midwife

## 2013-08-30 DIAGNOSIS — Z3482 Encounter for supervision of other normal pregnancy, second trimester: Secondary | ICD-10-CM

## 2013-08-30 DIAGNOSIS — Z3689 Encounter for other specified antenatal screening: Secondary | ICD-10-CM | POA: Insufficient documentation

## 2013-08-31 ENCOUNTER — Encounter: Payer: BC Managed Care – PPO | Admitting: Obstetrics

## 2013-11-01 ENCOUNTER — Ambulatory Visit (INDEPENDENT_AMBULATORY_CARE_PROVIDER_SITE_OTHER): Payer: BC Managed Care – PPO | Admitting: Obstetrics

## 2013-11-01 VITALS — BP 105/68 | HR 80 | Temp 98.5°F | Wt 217.0 lb

## 2013-11-01 DIAGNOSIS — Z3483 Encounter for supervision of other normal pregnancy, third trimester: Secondary | ICD-10-CM

## 2013-11-01 DIAGNOSIS — K219 Gastro-esophageal reflux disease without esophagitis: Secondary | ICD-10-CM

## 2013-11-01 DIAGNOSIS — Z3480 Encounter for supervision of other normal pregnancy, unspecified trimester: Secondary | ICD-10-CM

## 2013-11-01 LAB — POCT URINALYSIS DIPSTICK
Bilirubin, UA: NEGATIVE
Blood, UA: NEGATIVE
Glucose, UA: NEGATIVE
Ketones, UA: NEGATIVE
Leukocytes, UA: NEGATIVE
Nitrite, UA: NEGATIVE
Protein, UA: NEGATIVE
Spec Grav, UA: <=1.005
Urobilinogen, UA: NEGATIVE
pH, UA: 7

## 2013-11-01 MED ORDER — OMEPRAZOLE 20 MG PO CPDR: 20.0000 mg | DELAYED_RELEASE_CAPSULE | Freq: Two times a day (BID) | ORAL | Status: DC

## 2013-11-01 MED ORDER — OB COMPLETE PETITE 35-5-1-200 MG PO CAPS: 1.0000 | ORAL_CAPSULE | Freq: Every day | ORAL | Status: DC

## 2013-11-02 ENCOUNTER — Encounter: Payer: Self-pay | Admitting: Obstetrics

## 2013-11-02 NOTE — Progress Notes (Signed)
Subjective:    Annette Conway is a 23 y.o. female being seen today for her obstetrical visit. She is at [redacted]w[redacted]d gestation. Patient reports no complaints. Fetal movement: normal.  Problem List Items Addressed This Visit   None    Visit Diagnoses   Encounter for supervision of other normal pregnancy in third trimester    -  Primary    Relevant Medications       Prenat-FeCbn-FeAspGl-FA-Omega (OB COMPLETE PETITE) 35-5-1-200 MG CAPS    Other Relevant Orders       POCT urinalysis dipstick (Completed)    GERD without esophagitis        Relevant Medications       omeprazole (PRILOSEC) capsule      Patient Active Problem List   Diagnosis Date Noted  . Vaginal yeast infection 07/25/2013  . History of C-section 06/23/2013   Objective:    BP 105/68  Pulse 80  Temp(Src) 98.5 F (36.9 C)  Wt 217 lb (98.431 kg)  LMP 04/10/2013 FHT:  150 BPM  Uterine Size: size equals dates  Presentation: unsure     Assessment:    Pregnancy @ [redacted]w[redacted]d weeks   Plan:     labs reviewed, problem list updated Consent signed. GBS sent TDAP offered  Rhogam given for RH negative Pediatrician: discussed. Infant feeding: plans to breastfeed. Maternity leave: not discussed. Cigarette smoking: smokes 0.1 PPD. Orders Placed This Encounter  Procedures  . POCT urinalysis dipstick   Meds ordered this encounter  Medications  . omeprazole (PRILOSEC) 20 MG capsule    Sig: Take 1 capsule (20 mg total) by mouth 2 (two) times daily before a meal.    Dispense:  60 capsule    Refill:  5  . Prenat-FeCbn-FeAspGl-FA-Omega (OB COMPLETE PETITE) 35-5-1-200 MG CAPS    Sig: Take 1 capsule by mouth daily before breakfast.    Dispense:  90 capsule    Refill:  3   Follow up in 2 Weeks.

## 2013-11-16 ENCOUNTER — Encounter: Payer: BC Managed Care – PPO | Admitting: Obstetrics

## 2013-11-24 ENCOUNTER — Other Ambulatory Visit: Payer: Self-pay

## 2013-12-11 ENCOUNTER — Encounter: Payer: Self-pay | Admitting: Obstetrics

## 2014-04-18 ENCOUNTER — Encounter (HOSPITAL_COMMUNITY): Payer: Self-pay | Admitting: *Deleted

## 2018-08-08 ENCOUNTER — Emergency Department (HOSPITAL_COMMUNITY)
Admission: EM | Admit: 2018-08-08 | Discharge: 2018-08-08 | Disposition: A | Discharge status: Home / Self Care | Payer: Medicaid Other | Attending: Emergency Medicine | Admitting: Emergency Medicine

## 2018-08-08 ENCOUNTER — Other Ambulatory Visit: Payer: Self-pay

## 2018-08-08 ENCOUNTER — Encounter (HOSPITAL_COMMUNITY): Payer: Self-pay | Admitting: Emergency Medicine

## 2018-08-08 DIAGNOSIS — H538 Other visual disturbances: Secondary | ICD-10-CM | POA: Diagnosis not present

## 2018-08-08 DIAGNOSIS — N3000 Acute cystitis without hematuria: Secondary | ICD-10-CM

## 2018-08-08 DIAGNOSIS — Z79899 Other long term (current) drug therapy: Secondary | ICD-10-CM | POA: Diagnosis not present

## 2018-08-08 DIAGNOSIS — R51 Headache: Secondary | ICD-10-CM | POA: Insufficient documentation

## 2018-08-08 DIAGNOSIS — F17210 Nicotine dependence, cigarettes, uncomplicated: Secondary | ICD-10-CM | POA: Diagnosis not present

## 2018-08-08 DIAGNOSIS — F1290 Cannabis use, unspecified, uncomplicated: Secondary | ICD-10-CM | POA: Diagnosis not present

## 2018-08-08 DIAGNOSIS — B373 Candidiasis of vulva and vagina: Secondary | ICD-10-CM | POA: Diagnosis not present

## 2018-08-08 DIAGNOSIS — B379 Candidiasis, unspecified: Secondary | ICD-10-CM

## 2018-08-08 DIAGNOSIS — R1032 Left lower quadrant pain: Secondary | ICD-10-CM | POA: Diagnosis present

## 2018-08-08 LAB — CBC WITH DIFFERENTIAL/PLATELET
Abs Immature Granulocytes: 0.01 10*3/uL (ref 0.00–0.07)
Basophils Absolute: 0 10*3/uL (ref 0.0–0.1)
Basophils Relative: 1 %
Eosinophils Absolute: 0.8 10*3/uL — ABNORMAL HIGH (ref 0.0–0.5)
Eosinophils Relative: 14 %
HCT: 36.8 % (ref 36.0–46.0)
Hemoglobin: 12.4 g/dL (ref 12.0–15.0)
Immature Granulocytes: 0 %
Lymphocytes Relative: 36 %
Lymphs Abs: 2.2 10*3/uL (ref 0.7–4.0)
MCH: 31.2 pg (ref 26.0–34.0)
MCHC: 33.7 g/dL (ref 30.0–36.0)
MCV: 92.5 fL (ref 80.0–100.0)
Monocytes Absolute: 0.4 10*3/uL (ref 0.1–1.0)
Monocytes Relative: 7 %
Neutro Abs: 2.6 10*3/uL (ref 1.7–7.7)
Neutrophils Relative %: 42 %
Platelets: 284 10*3/uL (ref 150–400)
RBC: 3.98 MIL/uL (ref 3.87–5.11)
RDW: 13.2 % (ref 11.5–15.5)
WBC: 6.2 10*3/uL (ref 4.0–10.5)
nRBC: 0 % (ref 0.0–0.2)

## 2018-08-08 LAB — COMPREHENSIVE METABOLIC PANEL
ALT: 23 U/L (ref 0–44)
AST: 25 U/L (ref 15–41)
Albumin: 3.6 g/dL (ref 3.5–5.0)
Alkaline Phosphatase: 59 U/L (ref 38–126)
Anion gap: 8 (ref 5–15)
BUN: 5 mg/dL — ABNORMAL LOW (ref 6–20)
CO2: 24 mmol/L (ref 22–32)
Calcium: 9.3 mg/dL (ref 8.9–10.3)
Chloride: 108 mmol/L (ref 98–111)
Creatinine, Ser: 0.88 mg/dL (ref 0.44–1.00)
GFR calc Af Amer: >60 mL/min (ref >60)
GFR calc non Af Amer: >60 mL/min (ref >60)
Glucose, Bld: 86 mg/dL (ref 70–99)
Potassium: 3.8 mmol/L (ref 3.5–5.1)
Sodium: 140 mmol/L (ref 135–145)
Total Bilirubin: 0.5 mg/dL (ref 0.3–1.2)
Total Protein: 6.2 g/dL — ABNORMAL LOW (ref 6.5–8.1)

## 2018-08-08 LAB — URINALYSIS, ROUTINE W REFLEX MICROSCOPIC
Bilirubin Urine: NEGATIVE
Glucose, UA: NEGATIVE mg/dL
Hgb urine dipstick: NEGATIVE
Ketones, ur: NEGATIVE mg/dL
Nitrite: NEGATIVE
Protein, ur: 100 mg/dL — AB
Specific Gravity, Urine: 1.024 (ref 1.005–1.030)
pH: 8 (ref 5.0–8.0)

## 2018-08-08 LAB — LIPASE, BLOOD: Lipase: 25 U/L (ref 11–51)

## 2018-08-08 LAB — PREGNANCY, URINE: Preg Test, Ur: NEGATIVE

## 2018-08-08 MED ORDER — DIPHENHYDRAMINE HCL 25 MG PO CAPS
25.0000 mg | ORAL_CAPSULE | Freq: Once | ORAL | Status: AC
  Administered 2018-08-08: 25 mg via ORAL
  Filled 2018-08-08: qty 1

## 2018-08-08 MED ORDER — FLUCONAZOLE 200 MG PO TABS: ORAL_TABLET | ORAL | 0 refills | Status: DC

## 2018-08-08 MED ORDER — KETOROLAC TROMETHAMINE 30 MG/ML IJ SOLN
30.0000 mg | Freq: Once | INTRAMUSCULAR | Status: AC
Start: 2018-08-08 — End: 2018-08-08
  Administered 2018-08-08: 30 mg via INTRAMUSCULAR
  Filled 2018-08-08: qty 1

## 2018-08-08 MED ORDER — SULFAMETHOXAZOLE-TRIMETHOPRIM 800-160 MG PO TABS: 1.0000 | ORAL_TABLET | Freq: Two times a day (BID) | ORAL | 0 refills | Status: AC

## 2018-08-08 MED ORDER — PROCHLORPERAZINE MALEATE 5 MG PO TABS
10.0000 mg | ORAL_TABLET | Freq: Once | ORAL | Status: AC
  Administered 2018-08-08: 10 mg via ORAL
  Filled 2018-08-08: qty 2

## 2018-08-08 MED ORDER — SODIUM CHLORIDE 0.9 % IV BOLUS
1000.0000 mL | Freq: Once | INTRAVENOUS | Status: AC
  Administered 2018-08-08: 1000 mL via INTRAVENOUS

## 2018-08-08 NOTE — ED Provider Notes (Signed)
[facility] EMERGENCY DEPARTMENT Provider Note   CSN: [id number] Arrival date & time: 08/08/18  1227    History   Chief Complaint Chief Complaint  Patient presents with  . Headache  . Flank Pain    HPI Annette Conway is a 28 y.o. female who presents to the ED with complaints of gradual onset, constant, achy, diffuse headache with photophobia and phonophobia x  1 week. Pt has hx of migraine headaches and states this feels less severe; she has been taking Ibuprofen and Tylenol without relief. Denies fever, chills, neck stiffness, rash, vision changes, weakness or numbness, or any other associated symptoms.   Pt also complaining of urinary frequency and right flank pain x 5 days. She is concerned she could have a UTI. She also endorses thick white vaginal discharge that has been present for a couple of days. Pt is sexually active with her husband; they are in a monogamous relationship. She is not concerned about STDs at the moment.        Past Medical History:  Diagnosis Date  . Headache(784.0)   . UTI (urinary tract infection)   . Vaginal Pap smear, abnormal     Patient Active Problem List   Diagnosis Date Noted  . Vaginal yeast infection 07/25/2013  . History of C-section 06/23/2013    Past Surgical History:  Procedure Laterality Date  . CESAREAN SECTION       OB History    Gravida  2   Para  1   Term  1   Preterm      AB      Living  1     SAB      TAB      Ectopic      Multiple      Live Births  1            Home Medications    Prior to Admission medications   Medication Sig Start Date End Date Taking? Authorizing Provider  acetaminophen (TYLENOL) 500 MG tablet Take 1,000 mg by mouth every 6 (six) hours as needed for mild pain.   Yes [provider]  ibuprofen (ADVIL) 200 MG tablet Take 400 mg by mouth every 6 (six) hours as needed for fever, headache or mild pain.   Yes [provider]  Multiple  Vitamin (MULTIVITAMIN WITH MINERALS) TABS tablet Take 1 tablet by mouth daily.   Yes [provider]  fluconazole (DIFLUCAN) 200 MG tablet Take 1 tablet today and again in 7 days if you are still having symptoms. 08/08/18   Eustaquio Maize, PA-C  omeprazole (PRILOSEC) 20 MG capsule Take 1 capsule (20 mg total) by mouth 2 (two) times daily before a meal. Patient not taking: Reported on 08/08/2018 11/01/13   Shelly Bombard, MD  sulfamethoxazole-trimethoprim (BACTRIM DS) 800-160 MG tablet Take 1 tablet by mouth 2 (two) times daily for 14 days. 08/08/18 08/22/18  Eustaquio Maize, PA-C    Family History Family History  Problem Relation Age of Onset  . Diabetes Mother   . Diabetes Maternal Grandmother   . Diabetes Paternal Grandmother   . Anesthesia problems Neg Hx   . Hypotension Neg Hx   . Malignant hyperthermia Neg Hx   . Pseudochol deficiency Neg Hx     Social History Social History   Tobacco Use  . Smoking status: Current Every Day Smoker    Packs/day: 0.25    Years: 4.00    Pack years: 1.00  Types: Cigarettes    Last attempt to quit: 02/26/2011    Years since quitting: 7.4  . Smokeless tobacco: Never Used  Substance Use Topics  . Alcohol use: No  . Drug use: Yes    Types: Marijuana    Comment: trying to stop     Allergies   Patient has no known allergies.   Review of Systems Review of Systems  Constitutional: Negative for chills and fever.  HENT: Negative for congestion.   Eyes: Negative for visual disturbance.  Respiratory: Negative for cough and shortness of breath.   Cardiovascular: Negative for chest pain.  Gastrointestinal: Negative for abdominal pain, constipation, diarrhea, nausea and vomiting.  Genitourinary: Positive for flank pain, frequency and vaginal discharge. Negative for difficulty urinating, dysuria, pelvic pain and vaginal bleeding.  Musculoskeletal: Negative for myalgias.  Skin: Negative for rash.  Neurological: Positive for headaches.      Physical Exam Updated Vital Signs BP 116/79 (BP Location: Right Arm)   Pulse 77   Temp 98.3 F (36.8 C) (Oral)   Resp 16   Ht 5\' 7"  (1.702 m)   Wt 97.1 kg   LMP 07/11/2018 (Approximate)   SpO2 91%   BMI 33.52 kg/m   Physical Exam Vitals signs and nursing note reviewed.  Constitutional:      Appearance: She is not ill-appearing.  HENT:     Head: Normocephalic and atraumatic.  Eyes:     Conjunctiva/sclera: Conjunctivae normal.  Neck:     Musculoskeletal: Neck supple.  Cardiovascular:     Rate and Rhythm: Normal rate and regular rhythm.     Heart sounds: Normal heart sounds.  Pulmonary:     Effort: Pulmonary effort is normal.     Breath sounds: Normal breath sounds.  Abdominal:     Palpations: Abdomen is soft.     Tenderness: There is no abdominal tenderness. There is no guarding.     Comments: + Right CVA tenderness; no left CVA tenderness  Genitourinary:    Comments: Chaperone present for exam. No rashes, lesions, or tenderness to external genitalia. No erythema, injury, or tenderness to vaginal mucosa. Thick white discharge in vaginal vault. No adnexal masses, tenderness, or fullness. No CMT, cervical friability, or discharge from cervical os. Cervical os is closed.   Skin:    General: Skin is warm and dry.  Neurological:     Mental Status: She is alert.     Comments: CN 3-12 grossly intact A&O x4 GCS 15 Sensation and strength intact Gait nonataxic including with tandem walking Coordination with finger-to-nose WNL Neg pronator drift       ED Treatments / Results  Labs (all labs ordered are listed, but only abnormal results are displayed) Labs Reviewed  URINALYSIS, ROUTINE W REFLEX MICROSCOPIC - Abnormal; Notable for the following components:      Result Value   APPearance CLOUDY (*)    Protein, ur 100 (*)    Leukocytes,Ua TRACE (*)    Bacteria, UA RARE (*)    All other components within normal limits  COMPREHENSIVE METABOLIC PANEL - Abnormal;  Notable for the following components:   BUN 5 (*)    Total Protein 6.2 (*)    All other components within normal limits  CBC WITH DIFFERENTIAL/PLATELET - Abnormal; Notable for the following components:   Eosinophils Absolute 0.8 (*)    All other components within normal limits  WET PREP, GENITAL  LIPASE, BLOOD  PREGNANCY, URINE  I-STAT BETA HCG BLOOD, ED ([facility], [facility], [facility] ONLY)  GC/CHLAMYDIA PROBE AMP ([place]) NOT AT [facility]    EKG None  Radiology No results found.  Procedures Procedures (including critical care time)  Medications Ordered in ED Medications  prochlorperazine (COMPAZINE) tablet 10 mg (10 mg Oral Given 08/08/18 1332)  ketorolac (TORADOL) 30 MG/ML injection 30 mg (30 mg Intramuscular Given 08/08/18 1342)  diphenhydrAMINE (BENADRYL) capsule 25 mg (25 mg Oral Given 08/08/18 1332)  sodium chloride 0.9 % bolus 1,000 mL (0 mLs Intravenous Stopped 08/08/18 1514)     Initial Impression / Assessment and Plan / ED Course  I have reviewed the triage vital signs and the nursing notes.  Pertinent labs & imaging results that were available during my care of the patient were reviewed by me and considered in my medical decision making (see chart for details).    28 year old female presenting to the ED with complaints of headache, photophobia consistent with previous headaches although not as severe. No meningeal signs on exam; patient afebrile in the ED today. No focal neuro deficits on exam. Do not feel patient needs imaging at this time given hx of migraine headaches and this feels similar although not as severe. Will treat with headache cocktail today as well as IVF. Also complaining of frequency and right flank pain; has hx of UTI; vaginal discharge as well. No pelvic pain complaints; no adnexal or CMT tenderness on exam; do not feel patient needs pelvic ultrasound at this time. Pelvic exam performed with wet prep and GC/chlamydia prob. Baseline bloodwork obtained as well as U/A at  this time.   Labwork reassuring today. No leukocytosis to suggest infectious process. No electrolyte abnormalities. Lipase negative. U/A with leuks as well as bacteria 6-10 per HPF although also has 11-20 squamous cells. Given complaint of frequency, will treat for UTI today. Still awaiting wet prep results. Upon reevaluation patient reports her headache has improved.   Nursing staff informed that wet prep sample busted while on the way to the lab; will treat pt empirically for yeast infection as high suspicion for yeast infection. Advised patient that if she is still having symptoms that she will need to follow up with her PCP for a recheck. She is in agreement with plan at this time.       Final Clinical Impressions(s) / ED Diagnoses   Final diagnoses:  Acute cystitis without hematuria  Yeast infection    ED Discharge Orders         Ordered    fluconazole (DIFLUCAN) 200 MG tablet     08/08/18 1601    sulfamethoxazole-trimethoprim (BACTRIM DS) 800-160 MG tablet  2 times daily     08/08/18 1601           Eustaquio Maize, PA-C 08/08/18 1636    Nat Christen, MD 08/15/18 1549

## 2018-08-08 NOTE — ED Notes (Signed)
Patient Alert and oriented to baseline. Stable and ambulatory to baseline. Patient verbalized understanding of the discharge instructions.  Patient belongings were taken by the patient.   

## 2018-08-08 NOTE — Discharge Instructions (Signed)
You were seen in the ED today for a headache as well as flank pain and urinary frequency; your labs were reassuring today besides a UTI; please take the antibiotic as prescribed.   I am also treating you for a yeast infection; please take the medication as prescribed. If you are still having symptoms after you have taken the medicine please follow up with your PCP as you may need to be retested.

## 2018-08-08 NOTE — ED Triage Notes (Signed)
Pt reports top right h/a with light sensitivity x 1 week, has hx of migraines. Also reports right flank pain with urinary frequency x 5 days. Denies burning with urination, no fevers or sick contacts.

## 2018-08-09 LAB — GC/CHLAMYDIA PROBE AMP (~~LOC~~) NOT AT ARMC
Chlamydia: POSITIVE — AB
Neisseria Gonorrhea: NEGATIVE

## 2018-10-09 ENCOUNTER — Emergency Department (HOSPITAL_COMMUNITY)
Admission: EM | Admit: 2018-10-09 | Discharge: 2018-10-09 | Disposition: A | Discharge status: Home / Self Care | Payer: Medicaid Other | Attending: Emergency Medicine | Admitting: Emergency Medicine

## 2018-10-09 ENCOUNTER — Emergency Department (HOSPITAL_COMMUNITY): Payer: Medicaid Other

## 2018-10-09 ENCOUNTER — Other Ambulatory Visit: Payer: Self-pay

## 2018-10-09 ENCOUNTER — Encounter (HOSPITAL_COMMUNITY): Payer: Self-pay | Admitting: Emergency Medicine

## 2018-10-09 DIAGNOSIS — Z79899 Other long term (current) drug therapy: Secondary | ICD-10-CM | POA: Diagnosis not present

## 2018-10-09 DIAGNOSIS — N83201 Unspecified ovarian cyst, right side: Secondary | ICD-10-CM | POA: Insufficient documentation

## 2018-10-09 DIAGNOSIS — F17210 Nicotine dependence, cigarettes, uncomplicated: Secondary | ICD-10-CM | POA: Diagnosis not present

## 2018-10-09 DIAGNOSIS — N939 Abnormal uterine and vaginal bleeding, unspecified: Secondary | ICD-10-CM | POA: Insufficient documentation

## 2018-10-09 DIAGNOSIS — R1031 Right lower quadrant pain: Secondary | ICD-10-CM | POA: Diagnosis present

## 2018-10-09 DIAGNOSIS — B373 Candidiasis of vulva and vagina: Secondary | ICD-10-CM | POA: Insufficient documentation

## 2018-10-09 DIAGNOSIS — B3731 Acute candidiasis of vulva and vagina: Secondary | ICD-10-CM

## 2018-10-09 DIAGNOSIS — Z20828 Contact with and (suspected) exposure to other viral communicable diseases: Secondary | ICD-10-CM | POA: Diagnosis not present

## 2018-10-09 DIAGNOSIS — F1210 Cannabis abuse, uncomplicated: Secondary | ICD-10-CM | POA: Diagnosis not present

## 2018-10-09 LAB — URINALYSIS, ROUTINE W REFLEX MICROSCOPIC
Bilirubin Urine: NEGATIVE
Glucose, UA: NEGATIVE mg/dL
Ketones, ur: NEGATIVE mg/dL
Leukocytes,Ua: NEGATIVE
Nitrite: NEGATIVE
Protein, ur: NEGATIVE mg/dL
Specific Gravity, Urine: 1.013 (ref 1.005–1.030)
pH: 8 (ref 5.0–8.0)

## 2018-10-09 LAB — CBC WITH DIFFERENTIAL/PLATELET
Abs Immature Granulocytes: 0.01 10*3/uL (ref 0.00–0.07)
Basophils Absolute: 0 10*3/uL (ref 0.0–0.1)
Basophils Relative: 1 %
Eosinophils Absolute: 0.9 10*3/uL — ABNORMAL HIGH (ref 0.0–0.5)
Eosinophils Relative: 13 %
HCT: 35.1 % — ABNORMAL LOW (ref 36.0–46.0)
Hemoglobin: 11.6 g/dL — ABNORMAL LOW (ref 12.0–15.0)
Immature Granulocytes: 0 %
Lymphocytes Relative: 26 %
Lymphs Abs: 1.9 10*3/uL (ref 0.7–4.0)
MCH: 30.7 pg (ref 26.0–34.0)
MCHC: 33 g/dL (ref 30.0–36.0)
MCV: 92.9 fL (ref 80.0–100.0)
Monocytes Absolute: 0.5 10*3/uL (ref 0.1–1.0)
Monocytes Relative: 7 %
Neutro Abs: 4 10*3/uL (ref 1.7–7.7)
Neutrophils Relative %: 53 %
Platelets: 330 10*3/uL (ref 150–400)
RBC: 3.78 MIL/uL — ABNORMAL LOW (ref 3.87–5.11)
RDW: 13 % (ref 11.5–15.5)
WBC: 7.4 10*3/uL (ref 4.0–10.5)
nRBC: 0 % (ref 0.0–0.2)

## 2018-10-09 LAB — COMPREHENSIVE METABOLIC PANEL
ALT: 24 U/L (ref 0–44)
AST: 25 U/L (ref 15–41)
Albumin: 3.3 g/dL — ABNORMAL LOW (ref 3.5–5.0)
Alkaline Phosphatase: 68 U/L (ref 38–126)
Anion gap: 8 (ref 5–15)
BUN: 6 mg/dL (ref 6–20)
CO2: 24 mmol/L (ref 22–32)
Calcium: 8.6 mg/dL — ABNORMAL LOW (ref 8.9–10.3)
Chloride: 106 mmol/L (ref 98–111)
Creatinine, Ser: 0.81 mg/dL (ref 0.44–1.00)
GFR calc Af Amer: >60 mL/min (ref >60)
GFR calc non Af Amer: >60 mL/min (ref >60)
Glucose, Bld: 86 mg/dL (ref 70–99)
Potassium: 4 mmol/L (ref 3.5–5.1)
Sodium: 138 mmol/L (ref 135–145)
Total Bilirubin: 0.4 mg/dL (ref 0.3–1.2)
Total Protein: 6.8 g/dL (ref 6.5–8.1)

## 2018-10-09 LAB — WET PREP, GENITAL
Clue Cells Wet Prep HPF POC: NONE SEEN
Sperm: NONE SEEN
Trich, Wet Prep: NONE SEEN

## 2018-10-09 LAB — POC URINE PREG, ED: Preg Test, Ur: NEGATIVE

## 2018-10-09 LAB — SARS CORONAVIRUS 2 BY RT PCR (HOSPITAL ORDER, PERFORMED IN ~~LOC~~ HOSPITAL LAB): SARS Coronavirus 2: NEGATIVE

## 2018-10-09 LAB — I-STAT BETA HCG BLOOD, ED (MC, WL, AP ONLY): I-stat hCG, quantitative: <5 m[IU]/mL (ref <5)

## 2018-10-09 LAB — LIPASE, BLOOD: Lipase: 39 U/L (ref 11–51)

## 2018-10-09 MED ORDER — KETOROLAC TROMETHAMINE 30 MG/ML IJ SOLN
30.0000 mg | Freq: Once | INTRAMUSCULAR | Status: AC
  Administered 2018-10-09: 30 mg via INTRAVENOUS
  Filled 2018-10-09: qty 1

## 2018-10-09 MED ORDER — MEDROXYPROGESTERONE ACETATE 10 MG PO TABS: 20.0000 mg | ORAL_TABLET | Freq: Every day | ORAL | 2 refills | Status: DC

## 2018-10-09 MED ORDER — ONDANSETRON HCL 4 MG/2ML IJ SOLN
4.0000 mg | Freq: Once | INTRAMUSCULAR | Status: AC
  Administered 2018-10-09: 09:00:00 4 mg via INTRAVENOUS
  Filled 2018-10-09: qty 2

## 2018-10-09 MED ORDER — IOHEXOL 300 MG/ML  SOLN
100.0000 mL | Freq: Once | INTRAMUSCULAR | Status: AC | PRN
  Administered 2018-10-09: 100 mL via INTRAVENOUS

## 2018-10-09 MED ORDER — HYDROMORPHONE HCL 1 MG/ML IJ SOLN
0.5000 mg | INTRAMUSCULAR | Status: DC | PRN
  Administered 2018-10-09: 0.5 mg via INTRAVENOUS
  Filled 2018-10-09: qty 1

## 2018-10-09 MED ORDER — SODIUM CHLORIDE 0.9 % IV BOLUS
1000.0000 mL | Freq: Once | INTRAVENOUS | Status: AC
  Administered 2018-10-09: 1000 mL via INTRAVENOUS

## 2018-10-09 MED ORDER — MORPHINE SULFATE (PF) 4 MG/ML IV SOLN
4.0000 mg | Freq: Once | INTRAVENOUS | Status: AC
  Administered 2018-10-09: 4 mg via INTRAVENOUS
  Filled 2018-10-09: qty 1

## 2018-10-09 MED ORDER — HYDROMORPHONE HCL 1 MG/ML IJ SOLN
1.0000 mg | Freq: Once | INTRAMUSCULAR | Status: AC
  Administered 2018-10-09: 19:00:00 1 mg via INTRAVENOUS
  Filled 2018-10-09: qty 1

## 2018-10-09 MED ORDER — HYDROCODONE-ACETAMINOPHEN 5-325 MG PO TABS: 1.0000 | ORAL_TABLET | Freq: Four times a day (QID) | ORAL | 0 refills | Status: DC | PRN

## 2018-10-09 MED ORDER — MORPHINE SULFATE (PF) 4 MG/ML IV SOLN
4.0000 mg | Freq: Once | INTRAVENOUS | Status: AC
  Administered 2018-10-09: 10:00:00 4 mg via INTRAVENOUS
  Filled 2018-10-09: qty 1

## 2018-10-09 NOTE — ED Provider Notes (Signed)
[facility] EMERGENCY DEPARTMENT Provider Note   CSN: [id number] Arrival date & time: 10/09/18  [id number]     History   Chief Complaint Chief Complaint  Patient presents with  . Abdominal Pain    HPI Annette Conway is a 28 y.o. female with a past medical history of ectopic pregnancy on the right side, who presents today for evaluation of right-sided abdominal pain.  She reports that it started about 10 days ago was a normal cycle however her pain has worsened significantly.  She states that she is having an increasing bleeding.  She states that her pain has always been in the right lower quadrant radiating into her back.  She denies dysuria, increased frequency or urgency.  No nausea vomiting or diarrhea.  She states that she has been going through a significant amount of menstrual products including about 8 pads a day and has gone through 12-15 jumbo sized tampons a day.  She reports that she has never had bleeding like this before.       HPI  Past Medical History:  Diagnosis Date  . Headache(784.0)   . UTI (urinary tract infection)   . Vaginal Pap smear, abnormal     Patient Active Problem List   Diagnosis Date Noted  . Vaginal yeast infection 07/25/2013  . History of C-section 06/23/2013    Past Surgical History:  Procedure Laterality Date  . CESAREAN SECTION       OB History    Gravida  2   Para  1   Term  1   Preterm      AB      Living  1     SAB      TAB      Ectopic      Multiple      Live Births  1            Home Medications    Prior to Admission medications   Medication Sig Start Date End Date Taking? Authorizing Provider  acetaminophen (TYLENOL) 500 MG tablet Take 1,000 mg by mouth every 6 (six) hours as needed for mild pain.    [provider]  fluconazole (DIFLUCAN) 200 MG tablet Take 1 tablet today and again in 7 days if you are still having symptoms. 08/08/18   Alroy Bailiff, Margaux, PA-C  ibuprofen (ADVIL) 200 MG  tablet Take 400 mg by mouth every 6 (six) hours as needed for fever, headache or mild pain.    [provider]  Multiple Vitamin (MULTIVITAMIN WITH MINERALS) TABS tablet Take 1 tablet by mouth daily.    [provider]  omeprazole (PRILOSEC) 20 MG capsule Take 1 capsule (20 mg total) by mouth 2 (two) times daily before a meal. Patient not taking: Reported on 08/08/2018 11/01/13   Shelly Bombard, MD    Family History Family History  Problem Relation Age of Onset  . Diabetes Mother   . Diabetes Maternal Grandmother   . Diabetes Paternal Grandmother   . Anesthesia problems Neg Hx   . Hypotension Neg Hx   . Malignant hyperthermia Neg Hx   . Pseudochol deficiency Neg Hx     Social History Social History   Tobacco Use  . Smoking status: Current Every Day Smoker    Packs/day: 0.25    Years: 4.00    Pack years: 1.00    Types: Cigarettes    Last attempt to quit: 02/26/2011    Years since quitting: 7.6  .  Smokeless tobacco: Never Used  Substance Use Topics  . Alcohol use: No  . Drug use: Yes    Types: Marijuana    Comment: trying to stop     Allergies   Patient has no known allergies.   Review of Systems Review of Systems  Constitutional: Negative for chills and fever.  Respiratory: Negative for shortness of breath.   Cardiovascular: Negative for chest pain.  Gastrointestinal: Positive for abdominal pain. Negative for diarrhea, nausea and vomiting.  Genitourinary: Positive for menstrual problem, pelvic pain and vaginal bleeding. Negative for decreased urine volume, dysuria, flank pain, hematuria, urgency, vaginal discharge and vaginal pain.  Musculoskeletal: Positive for back pain. Negative for neck pain.  Psychiatric/Behavioral: Negative for confusion.  All other systems reviewed and are negative.    Physical Exam Updated Vital Signs BP 117/84   Pulse 77   Temp 98.2 F (36.8 C) (Oral)   Resp 17   SpO2 100%   Physical Exam Vitals signs and  nursing note reviewed. Exam conducted with a chaperone present (Abigail C. tech).  Constitutional:      Appearance: She is well-developed. She is not diaphoretic.     Comments: Appears uncomfortable.  HENT:     Head: Normocephalic and atraumatic.  Eyes:     General: No scleral icterus.       Right eye: No discharge.        Left eye: No discharge.     Conjunctiva/sclera: Conjunctivae normal.  Neck:     Musculoskeletal: Normal range of motion.  Cardiovascular:     Rate and Rhythm: Normal rate and regular rhythm.  Pulmonary:     Effort: Pulmonary effort is normal. No respiratory distress.     Breath sounds: No stridor.  Abdominal:     General: Abdomen is flat. Bowel sounds are normal. There is no distension.     Palpations: There is mass (Palpable mass/firmness in the suprapubic area.).     Tenderness: There is abdominal tenderness (Diffusely tender to palpation, significantly in the suprapubic and right lower quadrants.).     Hernia: No hernia is present.  Genitourinary:    Comments: Normal female external genitalia.  There is moderate amount of blood coming from the cervical os which is closed.  No cervical motion tenderness.  There is tenderness to palpation in the right adnexa.  Left adnexa is normal. Musculoskeletal:        General: No deformity.  Skin:    General: Skin is warm and dry.  Neurological:     General: No focal deficit present.     Mental Status: She is alert.     Motor: No abnormal muscle tone.  Psychiatric:        Mood and Affect: Mood normal.        Behavior: Behavior normal.      ED Treatments / Results  Labs (all labs ordered are listed, but only abnormal results are displayed) Labs Reviewed  WET PREP, GENITAL - Abnormal; Notable for the following components:      Result Value   Yeast Wet Prep HPF POC PRESENT (*)    WBC, Wet Prep HPF POC RARE (*)    All other components within normal limits  URINALYSIS, ROUTINE W REFLEX MICROSCOPIC - Abnormal;  Notable for the following components:   Hgb urine dipstick LARGE (*)    Bacteria, UA RARE (*)    All other components within normal limits  CBC WITH DIFFERENTIAL/PLATELET - Abnormal; Notable for the following components:  RBC 3.78 (*)    Hemoglobin 11.6 (*)    HCT 35.1 (*)    Eosinophils Absolute 0.9 (*)    All other components within normal limits  COMPREHENSIVE METABOLIC PANEL - Abnormal; Notable for the following components:   Calcium 8.6 (*)    Albumin 3.3 (*)    All other components within normal limits  SARS CORONAVIRUS 2 (HOSPITAL ORDER, [facility])  LIPASE, BLOOD  CBC WITH DIFFERENTIAL/PLATELET  I-STAT BETA HCG BLOOD, ED ([facility], [facility], [facility] ONLY)  POC URINE PREG, ED  GC/CHLAMYDIA PROBE AMP () NOT AT [facility]    EKG None  Radiology Ct Abdomen Pelvis W Contrast  Result Date: 10/09/2018 CLINICAL DATA:  Right abdominal pain and heavy vaginal bleeding for 10 days with passage of clots. EXAM: CT ABDOMEN AND PELVIS WITH CONTRAST TECHNIQUE: Multidetector CT imaging of the abdomen and pelvis was performed using the standard protocol following bolus administration of intravenous contrast. CONTRAST:  135mL OMNIPAQUE IOHEXOL 300 MG/ML  SOLN COMPARISON:  02/07/2010 CT abdomen/pelvis. FINDINGS: Lower chest: No significant pulmonary nodules or acute consolidative airspace disease. New mild bilateral pericardiophrenic adenopathy, largest 1.3 cm on the right (series 3/image 14). Hepatobiliary: Normal liver size. No liver mass. No radiopaque cholelithiasis or definite gallbladder wall thickening. No biliary ductal dilatation. Small amount of free fluid in the hepatorenal space. Pancreas: Normal, with no mass or duct dilation. Spleen: Normal size. No mass. Adrenals/Urinary Tract: Normal adrenals. Normal kidneys with no hydronephrosis and no renal mass. No definite bladder wall thickening accounting for relatively under distended state. Stomach/Bowel: Normal non-distended  stomach. Normal caliber small bowel with no small bowel wall thickening. The mid to distal appendix is mildly dilated (8 mm diameter) and mildly thick-walled, without significant periappendiceal fat stranding. No calcified appendicoliths. Normal large bowel with no diverticulosis or large bowel wall thickening. Vascular/Lymphatic: Normal caliber abdominal aorta. Patent portal, splenic, hepatic and renal veins. No pathologically enlarged lymph nodes in the abdomen or pelvis. Reproductive: Grossly normal uterus. Hypodense 3.1 cm right adnexal lesion (series 3/image 70). No left adnexal mass. Other: No pneumoperitoneum. Small volume mildly dense free fluid in the right lower quadrant with associated mild peritoneal thickening and enhancement (series 3/image 71). No focal fluid collection. Musculoskeletal: No aggressive appearing focal osseous lesions. IMPRESSION: 1. Mid to distal appendix is mildly dilated and mildly thick walled without significant periappendiceal fat stranding. Small volume dense free fluid in the right lower quadrant and hepatorenal space with associated mild peritoneal thickening and enhancement suggesting peritonitis. Nonspecific hypodense 3.1 cm right adnexal lesion, statistically most commonly a hemorrhagic right ovarian cyst. These findings are nonspecific and could be secondary to acute appendicitis or could be reactive to ruptured right ovarian hemorrhagic cyst. Surgical consultation advised. Pelvic ultrasound may be helpful for further evaluation. 2. No free air.  No focal/drainable fluid collection. 3. Mild bilateral pericardiophrenic lymphadenopathy, new since 2011 CT, nonspecific. Suggest follow-up chest CT with IV contrast in 3 months. Electronically Signed   By: Ilona Sorrel M.D.   On: 10/09/2018 13:17    Procedures Procedures (including critical care time)  Medications Ordered in ED Medications  HYDROmorphone (DILAUDID) injection 0.5 mg (0.5 mg Intravenous Given 10/09/18 1501)   sodium chloride 0.9 % bolus 1,000 mL (0 mLs Intravenous Stopped 10/09/18 1137)  morphine 4 MG/ML injection 4 mg (4 mg Intravenous Given 10/09/18 0902)  ondansetron (ZOFRAN) injection 4 mg (4 mg Intravenous Given 10/09/18 0903)  morphine 4 MG/ML injection 4 mg (4 mg Intravenous  Given 10/09/18 0956)  iohexol (OMNIPAQUE) 300 MG/ML solution 100 mL (100 mLs Intravenous Contrast Given 10/09/18 1235)     Initial Impression / Assessment and Plan / ED Course  I have reviewed the triage vital signs and the nursing notes.  Pertinent labs & imaging results that were available during my care of the patient were reviewed by me and considered in my medical decision making (see chart for details).  Clinical Course as of Oct 08 1601  Sun Oct 09, 2018  [id number] Pelvic exam done.  Patient states the morphine helped with her pain however she still continues to have significant pain.  Will order additional morphine.   [EH]  1408 Spoke with Dr. Redmond Pulling, he requests Ultrasound and call him back.    [EH]    Clinical Course User Index [EH] Lorin Glass, PA-C      Patient presents today for evaluation of right lower quadrant pain and vaginal bleeding.  Her vaginal bleeding is been present for 10 days and is significant more than normal with a menstrual cycle.  On exam she has significant tenderness in the suprapubic and right lower quadrant.  Labs are obtained and reviewed, she does not have a significant leukocytosis.    CT scan obtained showing appendicitis versus hemorrhagic cyst with peritonitis.  I spoke with Dr. Redmond Pulling from surgery who requests an ultrasound for additional evaluation and then to call him back.  Patient was kept n.p.o. while in the emergency room.  Her pain was treated with morphine and Dilaudid.  She was given Zofran.    She was informed of the incidental finding of lymphadenopathy and the need to follow-up with CT chest with IV contrast in the next 3 months.  At shift change care was  transferred to B. Henderly PA-C who will follow pending studies, re-evaulate and determine disposition.      Final Clinical Impressions(s) / ED Diagnoses   Final diagnoses:  RLQ abdominal pain  Yeast infection of the vagina    ED Discharge Orders    None       Lorin Glass, PA-C 10/09/18 Ruby, DO 10/10/18 1105

## 2018-10-09 NOTE — ED Notes (Addendum)
This RN entered the pt's room to offer pain medication and inform her the EDP would be in shortly to give her an update. Pt began yelling and screaming at staff stating "I have not seen a MD or a PA today and no one is telling me what is going on"  This RN along with [place] RN attempted to de-escalate the situation and explain to her the provider would be in to speak with her. She declined pain medication until she receives answers to er questions. At that time we decided to leave the room due to increasing agitation and escalating tension from patient. EDP informed.

## 2018-10-09 NOTE — ED Triage Notes (Addendum)
Pt here for eval of Right abdominal pain with 10 days of bleeding. States hx of ectopic. States the bleeding is heavy, with clots.

## 2018-10-09 NOTE — ED Notes (Signed)
Pt came out of room screaming at staff (multiple witnesses at and around nurses station) asking for a "real MD".  EDP and security escorted pt into the room to discuss treatment and follow up plan.

## 2018-10-09 NOTE — ED Notes (Signed)
Pelvic cart set up and ready at bedside 

## 2018-10-09 NOTE — ED Notes (Signed)
This RN accompanied the surgeon into the pt's room as a chaperon. The surgeon explained to patient about the cyst and the recommendations by the OB-GYN and pain control. This was not sufficient for the patient and she asked [place] to leave her room.

## 2018-10-09 NOTE — ED Provider Notes (Signed)
Care transferred from [place], [facility] at shift change.  See note for full HPI.  "Annette Conway is a 28 y.o. female with a past medical history of ectopic pregnancy on the right side, who presents today for evaluation of right-sided abdominal pain.  She reports that it started about 10 days ago was a normal cycle however her pain has worsened significantly.  She states that she is having an increasing bleeding.  She states that her pain has always been in the right lower quadrant radiating into her back.  She denies dysuria, increased frequency or urgency.  No nausea vomiting or diarrhea.  She states that she has been going through a significant amount of menstrual products including about 8 pads a day and has gone through 12-15 jumbo sized tampons a day.  She reports that she has never had bleeding like this before. "  CT AP with mildly appendix with mildly thick wall without significant periapical fat stranding.  She does have some mild free fluid in the right lower quadrant and hepatorenal space associated mild peritoneal thickening and enhancement suggesting peritonitis.  There is a 1.3 cm right adnexal lesion most commonly hemorrhagic right ovarian cyst however findings are nonspecific and could be secondary due to acute appendicitis or possibly reactive to ruptured right ovarian hemorrhagic cyst.  They recommended surgical consultation.  Previous provider consulted with Dr. Redmond Pulling, general surgery who recommended ultrasound and call back.  Labs without any acute abnormality.  Previous provider perform pelvic exam which had no cervical motion tenderness or adnexal tenderness.  She had low suspicion for PID, torsion, TOA, STD as cause of her pain.   Patient is aware she needs a follow-up chest CT.   Pain controlled with Dilaudid at care transfer.  Plan to follow-up with general surgery ultrasound results.  Disposition pending.   Physical Exam  BP (!) 128/92 (BP Location: Left Arm)   Pulse (!) 57    Temp 98.2 F (36.8 C) (Oral)   Resp (!) 24 Comment: crying  SpO2 100%   Physical Exam Vitals signs and nursing note reviewed.  Constitutional:      General: She is not in acute distress.    Appearance: She is well-developed. She is not ill-appearing, toxic-appearing or diaphoretic.  HENT:     Head: Normocephalic and atraumatic.     Nose: Nose normal.     Mouth/Throat:     Mouth: Mucous membranes are moist.     Pharynx: Oropharynx is clear.  Eyes:     Pupils: Pupils are equal, round, and reactive to light.  Neck:     Musculoskeletal: Normal range of motion.  Cardiovascular:     Rate and Rhythm: Normal rate.     Pulses: Normal pulses.     Heart sounds: Normal heart sounds.  Pulmonary:     Effort: Pulmonary effort is normal. No respiratory distress.     Breath sounds: Normal breath sounds and air entry.     Comments: Clear to auscultation bilaterally without wheeze, rhonchi or rales.  No accessory muscle usage.  Speaks in full sentences without difficulty. Abdominal:     General: Bowel sounds are normal. There is no distension.     Palpations: Abdomen is soft. There is no mass.     Tenderness: There is abdominal tenderness in the right lower quadrant. There is no right CVA tenderness, left CVA tenderness, guarding or rebound. Negative signs include Murphy's sign, Rovsing's sign, McBurney's sign, psoas sign and obturator sign.  Hernia: No hernia is present.     Comments: Mild tenderness to right lower quadrant without rebound or guarding.  Negative psoas, obturator, Rovsing sign.  No abdominal wall skin changes.  Normoactive bowel sounds.  No obvious herniations.  Peritoneal signs.  Musculoskeletal: Normal range of motion.     Comments: Moves all 4 extremities without difficulty.  Skin:    General: Skin is warm and dry.  Neurological:     Mental Status: She is alert.    ED Course/Procedures   Clinical Course as of Oct 09 2318  Sun Oct 09, 2018  [id number] Pelvic exam done.   Patient states the morphine helped with her pain however she still continues to have significant pain.  Will order additional morphine.   [EH]  1408 Spoke with Dr. Redmond Pulling, he requests Ultrasound and call him back.    [EH]    Clinical Course User Index [EH] Lorin Glass, PA-C   Procedures US Transvaginal Non-ob  Result Date: 10/09/2018 CLINICAL DATA:  Abnormal CT scan. EXAM: TRANSABDOMINAL AND TRANSVAGINAL ULTRASOUND OF PELVIS DOPPLER ULTRASOUND OF OVARIES TECHNIQUE: Both transabdominal and transvaginal ultrasound examinations of the pelvis were performed. Transabdominal technique was performed for global imaging of the pelvis including uterus, ovaries, adnexal regions, and pelvic cul-de-sac. It was necessary to proceed with endovaginal exam following the transabdominal exam to visualize the ovaries. Color and duplex Doppler ultrasound was utilized to evaluate blood flow to the ovaries. COMPARISON:  CT from same day FINDINGS: Uterus Measurements: 9.4 x 3.4 x 4.5 cm = volume: 75 mL. No fibroids or other mass visualized. Endometrium Thickness: 4 mm.  No focal abnormality visualized. Right ovary Measurements: 4.8 x 3.5 x 4.1 cm = volume: 36 mL. There is a complex appearing 3.1 x 2.9 x 2.9 cm cystic structure. Left ovary Measurements: 2.6 x 1.9 x 2.9 cm = volume: 7.2 mL. Normal appearance/no adnexal mass. Pulsed Doppler evaluation of both ovaries demonstrates normal waveforms involving the right ovary. The left-sided waveforms were suboptimal which is likely secondary to technique. Other findings There is a small amount of free fluid about the right ovary. IMPRESSION: 1. Probable 3.1 cm right ovarian hemorrhagic cyst. 2. Small amount of free fluid in the pelvis. 3. No convincing Doppler evidence for ovarian torsion, however the waveforms were somewhat suboptimal involving the left ovary as detailed above. Electronically Signed   By: Constance Holster M.D.   On: 10/09/2018 16:13   US Pelvis  Complete  Result Date: 10/09/2018 CLINICAL DATA:  Abnormal CT scan. EXAM: TRANSABDOMINAL AND TRANSVAGINAL ULTRASOUND OF PELVIS DOPPLER ULTRASOUND OF OVARIES TECHNIQUE: Both transabdominal and transvaginal ultrasound examinations of the pelvis were performed. Transabdominal technique was performed for global imaging of the pelvis including uterus, ovaries, adnexal regions, and pelvic cul-de-sac. It was necessary to proceed with endovaginal exam following the transabdominal exam to visualize the ovaries. Color and duplex Doppler ultrasound was utilized to evaluate blood flow to the ovaries. COMPARISON:  CT from same day FINDINGS: Uterus Measurements: 9.4 x 3.4 x 4.5 cm = volume: 75 mL. No fibroids or other mass visualized. Endometrium Thickness: 4 mm.  No focal abnormality visualized. Right ovary Measurements: 4.8 x 3.5 x 4.1 cm = volume: 36 mL. There is a complex appearing 3.1 x 2.9 x 2.9 cm cystic structure. Left ovary Measurements: 2.6 x 1.9 x 2.9 cm = volume: 7.2 mL. Normal appearance/no adnexal mass. Pulsed Doppler evaluation of both ovaries demonstrates normal waveforms involving the right ovary. The left-sided waveforms were suboptimal which is  likely secondary to technique. Other findings There is a small amount of free fluid about the right ovary. IMPRESSION: 1. Probable 3.1 cm right ovarian hemorrhagic cyst. 2. Small amount of free fluid in the pelvis. 3. No convincing Doppler evidence for ovarian torsion, however the waveforms were somewhat suboptimal involving the left ovary as detailed above. Electronically Signed   By: Constance Holster M.D.   On: 10/09/2018 16:13   Ct Abdomen Pelvis W Contrast  Result Date: 10/09/2018 CLINICAL DATA:  Right abdominal pain and heavy vaginal bleeding for 10 days with passage of clots. EXAM: CT ABDOMEN AND PELVIS WITH CONTRAST TECHNIQUE: Multidetector CT imaging of the abdomen and pelvis was performed using the standard protocol following bolus administration of  intravenous contrast. CONTRAST:  154mL OMNIPAQUE IOHEXOL 300 MG/ML  SOLN COMPARISON:  02/07/2010 CT abdomen/pelvis. FINDINGS: Lower chest: No significant pulmonary nodules or acute consolidative airspace disease. New mild bilateral pericardiophrenic adenopathy, largest 1.3 cm on the right (series 3/image 14). Hepatobiliary: Normal liver size. No liver mass. No radiopaque cholelithiasis or definite gallbladder wall thickening. No biliary ductal dilatation. Small amount of free fluid in the hepatorenal space. Pancreas: Normal, with no mass or duct dilation. Spleen: Normal size. No mass. Adrenals/Urinary Tract: Normal adrenals. Normal kidneys with no hydronephrosis and no renal mass. No definite bladder wall thickening accounting for relatively under distended state. Stomach/Bowel: Normal non-distended stomach. Normal caliber small bowel with no small bowel wall thickening. The mid to distal appendix is mildly dilated (8 mm diameter) and mildly thick-walled, without significant periappendiceal fat stranding. No calcified appendicoliths. Normal large bowel with no diverticulosis or large bowel wall thickening. Vascular/Lymphatic: Normal caliber abdominal aorta. Patent portal, splenic, hepatic and renal veins. No pathologically enlarged lymph nodes in the abdomen or pelvis. Reproductive: Grossly normal uterus. Hypodense 3.1 cm right adnexal lesion (series 3/image 70). No left adnexal mass. Other: No pneumoperitoneum. Small volume mildly dense free fluid in the right lower quadrant with associated mild peritoneal thickening and enhancement (series 3/image 71). No focal fluid collection. Musculoskeletal: No aggressive appearing focal osseous lesions. IMPRESSION: 1. Mid to distal appendix is mildly dilated and mildly thick walled without significant periappendiceal fat stranding. Small volume dense free fluid in the right lower quadrant and hepatorenal space with associated mild peritoneal thickening and enhancement  suggesting peritonitis. Nonspecific hypodense 3.1 cm right adnexal lesion, statistically most commonly a hemorrhagic right ovarian cyst. These findings are nonspecific and could be secondary to acute appendicitis or could be reactive to ruptured right ovarian hemorrhagic cyst. Surgical consultation advised. Pelvic ultrasound may be helpful for further evaluation. 2. No free air.  No focal/drainable fluid collection. 3. Mild bilateral pericardiophrenic lymphadenopathy, new since 2011 CT, nonspecific. Suggest follow-up chest CT with IV contrast in 3 months. Electronically Signed   By: Ilona Sorrel M.D.   On: 10/09/2018 13:17   [place] Art/ven Flow Abd Pelv Doppler  Result Date: 10/09/2018 CLINICAL DATA:  Abnormal CT scan. EXAM: TRANSABDOMINAL AND TRANSVAGINAL ULTRASOUND OF PELVIS DOPPLER ULTRASOUND OF OVARIES TECHNIQUE: Both transabdominal and transvaginal ultrasound examinations of the pelvis were performed. Transabdominal technique was performed for global imaging of the pelvis including uterus, ovaries, adnexal regions, and pelvic cul-de-sac. It was necessary to proceed with endovaginal exam following the transabdominal exam to visualize the ovaries. Color and duplex Doppler ultrasound was utilized to evaluate blood flow to the ovaries. COMPARISON:  CT from same day FINDINGS: Uterus Measurements: 9.4 x 3.4 x 4.5 cm = volume: 75 mL. No fibroids or other mass visualized. Endometrium  Thickness: 4 mm.  No focal abnormality visualized. Right ovary Measurements: 4.8 x 3.5 x 4.1 cm = volume: 36 mL. There is a complex appearing 3.1 x 2.9 x 2.9 cm cystic structure. Left ovary Measurements: 2.6 x 1.9 x 2.9 cm = volume: 7.2 mL. Normal appearance/no adnexal mass. Pulsed Doppler evaluation of both ovaries demonstrates normal waveforms involving the right ovary. The left-sided waveforms were suboptimal which is likely secondary to technique. Other findings There is a small amount of free fluid about the right ovary. IMPRESSION:  1. Probable 3.1 cm right ovarian hemorrhagic cyst. 2. Small amount of free fluid in the pelvis. 3. No convincing Doppler evidence for ovarian torsion, however the waveforms were somewhat suboptimal involving the left ovary as detailed above. Electronically Signed   By: Constance Holster M.D.   On: 10/09/2018 16:13   Labs Reviewed  WET PREP, GENITAL - Abnormal; Notable for the following components:      Result Value   Yeast Wet Prep HPF POC PRESENT (*)    WBC, Wet Prep HPF POC RARE (*)    All other components within normal limits  URINALYSIS, ROUTINE W REFLEX MICROSCOPIC - Abnormal; Notable for the following components:   Hgb urine dipstick LARGE (*)    Bacteria, UA RARE (*)    All other components within normal limits  CBC WITH DIFFERENTIAL/PLATELET - Abnormal; Notable for the following components:   RBC 3.78 (*)    Hemoglobin 11.6 (*)    HCT 35.1 (*)    Eosinophils Absolute 0.9 (*)    All other components within normal limits  COMPREHENSIVE METABOLIC PANEL - Abnormal; Notable for the following components:   Calcium 8.6 (*)    Albumin 3.3 (*)    All other components within normal limits  SARS CORONAVIRUS 2 (HOSPITAL ORDER, [place] [facility])  LIPASE, BLOOD  CBC WITH DIFFERENTIAL/PLATELET  I-STAT BETA HCG BLOOD, ED ([facility], [facility], [facility] ONLY)  POC URINE PREG, ED  GC/CHLAMYDIA PROBE AMP ([facility]) NOT AT [place] to consult with Dr. Redmond Pulling general surgery with [place] results for disposition.  [place] with probable right ovarian hemorrhagic cyst. No torsion. Small amount of free fluid in the pelvis.   Labs, imaging, nursing notes reviewed. At [place].  1630: Abd with mild tenderness palpation to right lower quadrant.  No rebound or guarding.  Patient is screaming upon entering the room yelling that she does not know "what is going on and has not been seen" by a provider.  I re-introduced myself to the patient and told her she was seen by Wyn Quaker previously who  is also a PA.  States she "does not know what is going on."  I thouroughly discussed with patient in room for 20 minutes results of CT findings and pending ultrasound findings at this time. She does not want anything additional for this pain however does have Dilaudid PRN ordered if needed by previous provider.  1710: Consulted with Dr. Redmond Pulling who requests to call OB/GYN for [place] results as he does not think patient has appendicitis based on CT findinds.  1724: Consulted with Dr. Roselie Awkward, OB/GYN who will be down to evaluate patient.  In to reevaluate patient however she still seems very upset and is crying.  Again discussed plan with patient with OB/GYN to evaluate.  She is in agreement and comfortable plan. Declines additional pain medications.  1800: Nursing has gone back into the room to give patient PRN pain medicine.  Patient yelling at nursing that she has not seen a provider.  I attempted to go into the room to discuss with patient again and try to de-escalate however patient is screaming at this provider and nursing. Per nursing, patient refused additional pain medicine.  OB/GYN, Dr. Roselie Awkward has evaluated patient. Unsure if cyst are cause of her pain however does not feel surgical intervetion is needed GYN wise at this time. If cyst cause of pain can dc home with pain meds and he has placed Follow up for OBGYN outpatient. Has also written scripts for vaginal bleeding.   1830: Nursing has asked me to go back in and discussed with patient as she is again very upset.  She is upset that she has had multiple abdominal exams.  Discussed with patient importance of serial abdominal exams and that GYN and surgery have to palpate abdomen during their evaluation to rule out surgical emergency.  I have called Dr. Redmond Pulling, general surgery again and asked to evaluate patient in person.  He is currently in the OR and agrees to evaluate the patient when he is out of the OR.  I have discussed this with patient.  Discussed  that this will likely be within the hour since Dr. Redmond Pulling is currently in surgery.  Discussed additional pain medicine which she is in agreement to now however continues to be very upset. I asked if there was anything else I can do for her and patient states "you can get out."  1915: Patient has been seen by Dr. Redmond Pulling, general surgery.  Does not feel patient currently has appendicitis.  Feels pain likely related to ruptured ovarian cyst.  Nonsurgical abdomen at this time.  Recommends DC home with pain medicine and follow-up with OB/GYN.  Patient requested surgeon and nursing to leave her room as she was not "satisfied with your answer for my pain."  1930: Patient currently screaming in the hall yelling at nursing staff.  Currently screaming obscenities.  Nursing had security escort patient back to room.  Discussed with attending physician, Dr. Laverta Baltimore.  Recommends outpatient follow-up and pain medicine.  Discussed plan with patient.  Will DC home with pain medicine.  She does have follow-up with OB/GYN per Dr. Roselie Awkward.  I have low suspicion for surgical or medical emergency at this time.  Low suspicion for mesenteric ischemia at this time as cause of her pain. I have discussed with patient possible inpatient management for intractable abdominal pain however patient states "you and this hospital can go screw yourself."  And requests to be discharged.  Low suspicion for torsion, PID, TOA, appendicitis, diverticulitis, perforated abdomen.  Patient tolerating p.o. intake in ED without difficulty.  She denied any episodes of emesis or hematemesis.  Vital signs have remained stable.  Patient DC home in stable condition.  The patient has been appropriately medically screened and/or stabilized in the ED. I have low suspicion for any other emergent medical condition which would require further screening, evaluation or treatment in the ED or require inpatient management.  Patient is hemodynamically stable and in no acute  distress.  Patient able to ambulate in department prior to ED.  Evaluation does not show acute pathology that would require ongoing or additional emergent interventions while in the emergency department or further inpatient treatment.  I have discussed the diagnosis with the patient and answered all questions.  Pain is been managed while in the emergency department and patient has no further complaints prior to discharge.  Patient is comfortable with  plan discussed in room and is stable for discharge at this time.  I have discussed strict return precautions for returning to the emergency department.  Patient was encouraged to follow-up with PCP/specialist refer to at discharge.      Nettie Elm, PA-C 10/09/18 2320    Margette Fast, MD 10/10/18 1039

## 2018-10-09 NOTE — ED Notes (Signed)
Patient verbalizes understanding of discharge instructions. Opportunity for questioning and answers were provided. Armband removed by staff, pt discharged from ED.   Offered pt a wheelchair at d/c, pt refused.

## 2018-10-09 NOTE — ED Notes (Addendum)
Pt walked into hallway as nurse was sitting at nurses desk. Pt yelled "excuse me." at this RN. RN responded, pt began yelling at RN asking for a doctor to come see her. Stating, "I've been here for 12 hours since 8 o'clock this morning. No one has answers for me! A surgeon was just in my room talking about an 'educated guess'. That's all anyone has for me in 12 hours is an educated guess. All anyone has given me is morhpine and dilaudid. I'm not a drug addict!" Security walking up as pt was yelling speaking to pt, assisting staff to get pt back in room. RN offered emotional support, Pt still yelling in harsh tones. This RN informed by other RN that a doctor was coming to speak to her. This RN informed pt of that.

## 2018-10-09 NOTE — ED Notes (Signed)
Notified EDP pt wanting an update.

## 2018-10-09 NOTE — Discharge Instructions (Addendum)
Today your CT scan showed enlarged lymph nodes in your chest.  The recommendation is for a follow-up CT scan on your chest in 3 months with IV contrast.  It is very important that you get this follow-up.  Take the pain medicine as prescribed.  Also place heat to your right lower quadrant.  You will be called tomorrow to schedule appointment with OB/GYN to follow-up for your cyst.  Also take 800 mg of ibuprofen every 8 hours for pain.  Do not exceed more than 2400 mg daily

## 2018-10-09 NOTE — Consult Note (Signed)
Reason for Consult:abdominal pain, r/o appendicitis Referring Physician: Shelby Dubin PA  Annette Conway is an 28 y.o. female.  HPI: 28 year old African-American female reports 10-day history of right-sided abdominal pain along with 10 days of heavier than normal vaginal bleeding.  She states that she normally has about 3 days of flow and minimal discomfort.  She came to the emergency room because of persistent pain on the right side of her abdomen.  She states that it hurts to bend over, cough, or pick up her child.  She denies any fever, chills, nausea or vomiting.  Last bowel movement was today it was a little bit soft.  She has had 2 C-sections.  She also reports increased urinary frequency and maybe a twinge of blood.  She has been able to eat during all this time but reports her appetite may be a little bit diminished.  She denies any unprotected intercourse.  She had a CT scan earlier today which showed the findings below.  There is question of possible appendicitis given a little bit of appendiceal diameter of 8 mm with mild wall thickening.  The patient also had evidence of a right ovarian cyst that was enlarged with fluid in the pelvis and fluid in the right lower quadrant and hepatorenal space.  She then underwent a transvaginal ultrasound as well as evaluation by gynecology.  Past Medical History:  Diagnosis Date   Headache(784.0)    UTI (urinary tract infection)    Vaginal Pap smear, abnormal     Past Surgical History:  Procedure Laterality Date   CESAREAN SECTION      Family History  Problem Relation Age of Onset   Diabetes Mother    Diabetes Maternal Grandmother    Diabetes Paternal Grandmother    Anesthesia problems Neg Hx    Hypotension Neg Hx    Malignant hyperthermia Neg Hx    Pseudochol deficiency Neg Hx     Social History:  reports that she has been smoking cigarettes. She has a 1.00 pack-year smoking history. She has never used smokeless tobacco. She  reports current drug use. Drug: Marijuana. She reports that she does not drink alcohol.  Allergies: No Known Allergies  Medications: I have reviewed the patient's current medications.  Results for orders placed or performed during the hospital encounter of 10/09/18 (from the past 48 hour(s))  POC Urine Pregnancy, ED (not at [facility])     Status: None   Collection Time: 10/09/18  7:53 AM  Result Value Ref Range   Preg Test, Ur NEGATIVE NEGATIVE    Comment:        THE SENSITIVITY OF THIS METHODOLOGY IS >24 mIU/mL   CBC with Differential/Platelet     Status: Abnormal   Collection Time: 10/09/18  9:20 AM  Result Value Ref Range   WBC 7.4 4.0 - 10.5 K/uL   RBC 3.78 (L) 3.87 - 5.11 MIL/uL   Hemoglobin 11.6 (L) 12.0 - 15.0 g/dL   HCT 35.1 (L) 36.0 - 46.0 %   MCV 92.9 80.0 - 100.0 fL   MCH 30.7 26.0 - 34.0 pg   MCHC 33.0 30.0 - 36.0 g/dL   RDW 13.0 11.5 - 15.5 %   Platelets 330 150 - 400 K/uL   nRBC 0.0 0.0 - 0.2 %   Neutrophils Relative % 53 %   Neutro Abs 4.0 1.7 - 7.7 K/uL   Lymphocytes Relative 26 %   Lymphs Abs 1.9 0.7 - 4.0 K/uL   Monocytes Relative 7 %  Monocytes Absolute 0.5 0.1 - 1.0 K/uL   Eosinophils Relative 13 %   Eosinophils Absolute 0.9 (H) 0.0 - 0.5 K/uL   Basophils Relative 1 %   Basophils Absolute 0.0 0.0 - 0.1 K/uL   WBC Morphology See Note     Comment: Vaculated Neutrophils   Immature Granulocytes 0 %   Abs Immature Granulocytes 0.01 0.00 - 0.07 K/uL    Comment: Performed at [facility] [address], [place], [place] [address]  Wet prep, genital     Status: Abnormal   Collection Time: 10/09/18  9:40 AM   Specimen: Cervical/Vaginal swab  Result Value Ref Range   Yeast Wet Prep HPF POC PRESENT (A) NONE SEEN   Trich, Wet Prep NONE SEEN NONE SEEN   Clue Cells Wet Prep HPF POC NONE SEEN NONE SEEN   WBC, Wet Prep HPF POC RARE (A) NONE SEEN   Sperm NONE SEEN     Comment: Performed at [facility], [place] [address] [place], [place] [address]   Urinalysis, Routine w reflex microscopic     Status: Abnormal   Collection Time: 10/09/18 10:30 AM  Result Value Ref Range   Color, Urine YELLOW YELLOW   APPearance CLEAR CLEAR   Specific Gravity, Urine 1.013 1.005 - 1.030   pH 8.0 5.0 - 8.0   Glucose, UA NEGATIVE NEGATIVE mg/dL   Hgb urine dipstick LARGE (A) NEGATIVE   Bilirubin Urine NEGATIVE NEGATIVE   Ketones, ur NEGATIVE NEGATIVE mg/dL   Protein, ur NEGATIVE NEGATIVE mg/dL   Nitrite NEGATIVE NEGATIVE   Leukocytes,Ua NEGATIVE NEGATIVE   RBC / HPF 11-20 0 - 5 RBC/hpf   WBC, UA 0-5 0 - 5 WBC/hpf   Bacteria, UA RARE (A) NONE SEEN   Squamous Epithelial / LPF 0-5 0 - 5    Comment: Performed at [facility], [place] [address], [place], [place] [address]  Comprehensive metabolic panel     Status: Abnormal   Collection Time: 10/09/18 11:35 AM  Result Value Ref Range   Sodium 138 135 - 145 mmol/L   Potassium 4.0 3.5 - 5.1 mmol/L   Chloride 106 98 - 111 mmol/L   CO2 24 22 - 32 mmol/L   Glucose, Bld 86 70 - 99 mg/dL   BUN 6 6 - 20 mg/dL   Creatinine, Ser 0.81 0.44 - 1.00 mg/dL   Calcium 8.6 (L) 8.9 - 10.3 mg/dL   Total Protein 6.8 6.5 - 8.1 g/dL   Albumin 3.3 (L) 3.5 - 5.0 g/dL   AST 25 15 - 41 U/L   ALT 24 0 - 44 U/L   Alkaline Phosphatase 68 38 - 126 U/L   Total Bilirubin 0.4 0.3 - 1.2 mg/dL   GFR calc non Af Amer >60 >60 mL/min   GFR calc Af Amer >60 >60 mL/min   Anion gap 8 5 - 15    Comment: Performed at [facility] [address], [place], [place] [address]  Lipase, blood     Status: None   Collection Time: 10/09/18 11:35 AM  Result Value Ref Range   Lipase 39 11 - 51 U/L    Comment: Performed at [facility] [address], [place],  [address]  SARS Coronavirus 2 [facility] order, Performed in [facility] hospital lab) Nasopharyngeal Nasopharyngeal Swab     Status: None   Collection Time: 10/09/18  2:53 PM   Specimen: Nasopharyngeal Swab  Result Value Ref Range  SARS Coronavirus 2 NEGATIVE  NEGATIVE    Comment: (NOTE) If result is NEGATIVE SARS-CoV-2 target nucleic acids are NOT DETECTED. The SARS-CoV-2 RNA is generally detectable in upper and lower  respiratory specimens during the acute phase of infection. The lowest  concentration of SARS-CoV-2 viral copies this assay can detect is 250  copies / mL. A negative result does not preclude SARS-CoV-2 infection  and should not be used as the sole basis for treatment or other  patient management decisions.  A negative result may occur with  improper specimen collection / handling, submission of specimen other  than nasopharyngeal swab, presence of viral mutation(s) within the  areas targeted by this assay, and inadequate number of viral copies  (<250 copies / mL). A negative result must be combined with clinical  observations, patient history, and epidemiological information. If result is POSITIVE SARS-CoV-2 target nucleic acids are DETECTED. The SARS-CoV-2 RNA is generally detectable in upper and lower  respiratory specimens dur ing the acute phase of infection.  Positive  results are indicative of active infection with SARS-CoV-2.  Clinical  correlation with patient history and other diagnostic information is  necessary to determine patient infection status.  Positive results do  not rule out bacterial infection or co-infection with other viruses. If result is PRESUMPTIVE POSTIVE SARS-CoV-2 nucleic acids MAY BE PRESENT.   A presumptive positive result was obtained on the submitted specimen  and confirmed on repeat testing.  While 2019 novel coronavirus  (SARS-CoV-2) nucleic acids may be present in the submitted sample  additional confirmatory testing may be necessary for epidemiological  and / or clinical management purposes  to differentiate between  SARS-CoV-2 and other Sarbecovirus currently known to infect humans.  If clinically indicated additional testing with an alternate test  methodology [phone]) is advised. The  SARS-CoV-2 RNA is generally  detectable in upper and lower respiratory sp ecimens during the acute  phase of infection. The expected result is Negative. Fact Sheet for Patients:  StrictlyIdeas.no Fact Sheet for Healthcare Providers: BankingDealers.co.za This test is not yet approved or cleared by the [place] FDA and has been authorized for detection and/or diagnosis of SARS-CoV-2 by FDA under an Emergency Use Authorization (EUA).  This EUA will remain in effect (meaning this test can be used) for the duration of the COVID-19 declaration under Section 564(b)(1) of the Act, 21 U.S.C. section 360bbb-3(b)(1), unless the authorization is terminated or revoked sooner. Performed at [facility], [place] [address], [place], [place] [address]   I-Stat beta hCG blood, ED     Status: None   Collection Time: 10/09/18  3:12 PM  Result Value Ref Range   I-stat hCG, quantitative <5.0 <5 mIU/mL   Comment 3            Comment:   GEST. AGE      CONC.  (mIU/mL)   <=1 WEEK        5 - 50     2 WEEKS       50 - 500     3 WEEKS       100 - 10,000     4 WEEKS     1,000 - 30,000        FEMALE AND NON-PREGNANT FEMALE:     LESS THAN 5 mIU/mL     US Transvaginal Non-ob  Result Date: 10/09/2018 CLINICAL DATA:  Abnormal CT scan. EXAM: TRANSABDOMINAL AND TRANSVAGINAL ULTRASOUND OF PELVIS DOPPLER ULTRASOUND OF OVARIES TECHNIQUE: Both transabdominal and  transvaginal ultrasound examinations of the pelvis were performed. Transabdominal technique was performed for global imaging of the pelvis including uterus, ovaries, adnexal regions, and pelvic cul-de-sac. It was necessary to proceed with endovaginal exam following the transabdominal exam to visualize the ovaries. Color and duplex Doppler ultrasound was utilized to evaluate blood flow to the ovaries. COMPARISON:  CT from same day FINDINGS: Uterus Measurements: 9.4 x 3.4 x 4.5 cm = volume: 75 mL. No fibroids or  other mass visualized. Endometrium Thickness: 4 mm.  No focal abnormality visualized. Right ovary Measurements: 4.8 x 3.5 x 4.1 cm = volume: 36 mL. There is a complex appearing 3.1 x 2.9 x 2.9 cm cystic structure. Left ovary Measurements: 2.6 x 1.9 x 2.9 cm = volume: 7.2 mL. Normal appearance/no adnexal mass. Pulsed Doppler evaluation of both ovaries demonstrates normal waveforms involving the right ovary. The left-sided waveforms were suboptimal which is likely secondary to technique. Other findings There is a small amount of free fluid about the right ovary. IMPRESSION: 1. Probable 3.1 cm right ovarian hemorrhagic cyst. 2. Small amount of free fluid in the pelvis. 3. No convincing Doppler evidence for ovarian torsion, however the waveforms were somewhat suboptimal involving the left ovary as detailed above. Electronically Signed   By: Constance Holster M.D.   On: 10/09/2018 16:13   US Pelvis Complete  Result Date: 10/09/2018 CLINICAL DATA:  Abnormal CT scan. EXAM: TRANSABDOMINAL AND TRANSVAGINAL ULTRASOUND OF PELVIS DOPPLER ULTRASOUND OF OVARIES TECHNIQUE: Both transabdominal and transvaginal ultrasound examinations of the pelvis were performed. Transabdominal technique was performed for global imaging of the pelvis including uterus, ovaries, adnexal regions, and pelvic cul-de-sac. It was necessary to proceed with endovaginal exam following the transabdominal exam to visualize the ovaries. Color and duplex Doppler ultrasound was utilized to evaluate blood flow to the ovaries. COMPARISON:  CT from same day FINDINGS: Uterus Measurements: 9.4 x 3.4 x 4.5 cm = volume: 75 mL. No fibroids or other mass visualized. Endometrium Thickness: 4 mm.  No focal abnormality visualized. Right ovary Measurements: 4.8 x 3.5 x 4.1 cm = volume: 36 mL. There is a complex appearing 3.1 x 2.9 x 2.9 cm cystic structure. Left ovary Measurements: 2.6 x 1.9 x 2.9 cm = volume: 7.2 mL. Normal appearance/no adnexal mass. Pulsed Doppler  evaluation of both ovaries demonstrates normal waveforms involving the right ovary. The left-sided waveforms were suboptimal which is likely secondary to technique. Other findings There is a small amount of free fluid about the right ovary. IMPRESSION: 1. Probable 3.1 cm right ovarian hemorrhagic cyst. 2. Small amount of free fluid in the pelvis. 3. No convincing Doppler evidence for ovarian torsion, however the waveforms were somewhat suboptimal involving the left ovary as detailed above. Electronically Signed   By: Constance Holster M.D.   On: 10/09/2018 16:13   Ct Abdomen Pelvis W Contrast  Result Date: 10/09/2018 CLINICAL DATA:  Right abdominal pain and heavy vaginal bleeding for 10 days with passage of clots. EXAM: CT ABDOMEN AND PELVIS WITH CONTRAST TECHNIQUE: Multidetector CT imaging of the abdomen and pelvis was performed using the standard protocol following bolus administration of intravenous contrast. CONTRAST:  160mL OMNIPAQUE IOHEXOL 300 MG/ML  SOLN COMPARISON:  02/07/2010 CT abdomen/pelvis. FINDINGS: Lower chest: No significant pulmonary nodules or acute consolidative airspace disease. New mild bilateral pericardiophrenic adenopathy, largest 1.3 cm on the right (series 3/image 14). Hepatobiliary: Normal liver size. No liver mass. No radiopaque cholelithiasis or definite gallbladder wall thickening. No biliary ductal dilatation. Small amount of free fluid  in the hepatorenal space. Pancreas: Normal, with no mass or duct dilation. Spleen: Normal size. No mass. Adrenals/Urinary Tract: Normal adrenals. Normal kidneys with no hydronephrosis and no renal mass. No definite bladder wall thickening accounting for relatively under distended state. Stomach/Bowel: Normal non-distended stomach. Normal caliber small bowel with no small bowel wall thickening. The mid to distal appendix is mildly dilated (8 mm diameter) and mildly thick-walled, without significant periappendiceal fat stranding. No calcified  appendicoliths. Normal large bowel with no diverticulosis or large bowel wall thickening. Vascular/Lymphatic: Normal caliber abdominal aorta. Patent portal, splenic, hepatic and renal veins. No pathologically enlarged lymph nodes in the abdomen or pelvis. Reproductive: Grossly normal uterus. Hypodense 3.1 cm right adnexal lesion (series 3/image 70). No left adnexal mass. Other: No pneumoperitoneum. Small volume mildly dense free fluid in the right lower quadrant with associated mild peritoneal thickening and enhancement (series 3/image 71). No focal fluid collection. Musculoskeletal: No aggressive appearing focal osseous lesions. IMPRESSION: 1. Mid to distal appendix is mildly dilated and mildly thick walled without significant periappendiceal fat stranding. Small volume dense free fluid in the right lower quadrant and hepatorenal space with associated mild peritoneal thickening and enhancement suggesting peritonitis. Nonspecific hypodense 3.1 cm right adnexal lesion, statistically most commonly a hemorrhagic right ovarian cyst. These findings are nonspecific and could be secondary to acute appendicitis or could be reactive to ruptured right ovarian hemorrhagic cyst. Surgical consultation advised. Pelvic ultrasound may be helpful for further evaluation. 2. No free air.  No focal/drainable fluid collection. 3. Mild bilateral pericardiophrenic lymphadenopathy, new since 2011 CT, nonspecific. Suggest follow-up chest CT with IV contrast in 3 months. Electronically Signed   By: Ilona Sorrel M.D.   On: 10/09/2018 13:17   [place] Art/ven Flow Abd Pelv Doppler  Result Date: 10/09/2018 CLINICAL DATA:  Abnormal CT scan. EXAM: TRANSABDOMINAL AND TRANSVAGINAL ULTRASOUND OF PELVIS DOPPLER ULTRASOUND OF OVARIES TECHNIQUE: Both transabdominal and transvaginal ultrasound examinations of the pelvis were performed. Transabdominal technique was performed for global imaging of the pelvis including uterus, ovaries, adnexal regions, and  pelvic cul-de-sac. It was necessary to proceed with endovaginal exam following the transabdominal exam to visualize the ovaries. Color and duplex Doppler ultrasound was utilized to evaluate blood flow to the ovaries. COMPARISON:  CT from same day FINDINGS: Uterus Measurements: 9.4 x 3.4 x 4.5 cm = volume: 75 mL. No fibroids or other mass visualized. Endometrium Thickness: 4 mm.  No focal abnormality visualized. Right ovary Measurements: 4.8 x 3.5 x 4.1 cm = volume: 36 mL. There is a complex appearing 3.1 x 2.9 x 2.9 cm cystic structure. Left ovary Measurements: 2.6 x 1.9 x 2.9 cm = volume: 7.2 mL. Normal appearance/no adnexal mass. Pulsed Doppler evaluation of both ovaries demonstrates normal waveforms involving the right ovary. The left-sided waveforms were suboptimal which is likely secondary to technique. Other findings There is a small amount of free fluid about the right ovary. IMPRESSION: 1. Probable 3.1 cm right ovarian hemorrhagic cyst. 2. Small amount of free fluid in the pelvis. 3. No convincing Doppler evidence for ovarian torsion, however the waveforms were somewhat suboptimal involving the left ovary as detailed above. Electronically Signed   By: Constance Holster M.D.   On: 10/09/2018 16:13    Review of Systems  Constitutional: Negative for chills, fever and weight loss.  HENT: Negative for nosebleeds.   Eyes: Negative for blurred vision.  Respiratory: Negative for shortness of breath.   Cardiovascular: Negative for chest pain, palpitations, orthopnea and PND.  Denies DOE  Gastrointestinal: Positive for abdominal pain. Negative for blood in stool, constipation, nausea and vomiting.  Genitourinary: Positive for frequency and hematuria. Negative for dysuria.  Musculoskeletal: Negative.   Skin: Negative for itching and rash.  Neurological: Negative for dizziness, focal weakness, seizures, loss of consciousness and headaches.       Denies TIAs, amaurosis fugax  Endo/Heme/Allergies:  Does not bruise/bleed easily.  Psychiatric/Behavioral: The patient is not nervous/anxious.    Blood pressure (!) 128/92, pulse (!) 57, temperature 98.2 F (36.8 C), temperature source Oral, resp. rate (!) 24, SpO2 100 %, unknown if currently breastfeeding. Physical Exam  Vitals reviewed. Constitutional: She is oriented to person, place, and time. Vital signs are normal. She appears well-developed and well-nourished.  Non-toxic appearance. She does not have a sickly appearance. No distress.  Pt walking in room on phone when entered; nurse chaperone present for entire consult/exam  HENT:  Head: Normocephalic and atraumatic.  Right Ear: External ear normal.  Left Ear: External ear normal.  Eyes: Conjunctivae are normal. No scleral icterus.  Neck: Normal range of motion. Neck supple. No tracheal deviation present. No thyromegaly present.  Cardiovascular: Normal rate and normal heart sounds.  Respiratory: Effort normal and breath sounds normal. No stridor. No respiratory distress. She has no wheezes.  GI: There is abdominal tenderness in the right upper quadrant and right lower quadrant. There is no rigidity, no rebound and no guarding.    Right sided TTP; no rebound/guarding.   Musculoskeletal:        General: No tenderness or edema.  Lymphadenopathy:    She has no cervical adenopathy.  Neurological: She is alert and oriented to person, place, and time. She exhibits normal muscle tone.  Skin: Skin is warm and dry. No rash noted. She is not diaphoretic. No erythema. No pallor.  Psychiatric: Her behavior is normal. Judgment and thought content normal. Her affect is angry.    Assessment/Plan: Right-sided abdominal pain Increased vaginal bleeding Right hemorrhagic ovarian cyst with probable rupture Bilateral pericardiophrenic lymphadenopathy  I do not believe the patient has acute appendicitis.  Her clinical history is not consistent with appendicitis.  She has no fever.  She has no  leukocytosis.  She has no left shift.  She has barely minimal dilated appendix with no periappendiceal stranding.  If she truly has had appendix issues for this long she should have more overt signs of radiological evidence of appendicitis or leukocytosis.  I believe that the blood from the ruptured hemorrhagic ovarian cyst is irritating her peritoneum which is causing her pain with physical activity.  I tried my best to explain this but the patient became angry and agitated.  We discussed non-opioid pain control.  She asked to speak with the doctor that was going to treat her hemorrhagic cyst and what they were going to do for it.  I advised her that I was consulted to evaluate the appendix and to give my opinion about that but I would relay my findings and recommendations to the PA who could discuss it with gynecology.  The patient asked the nurse and I did leave the room  I did discuss with the PA that she may need follow-up imaging in 3 months to make sure that that bilateral pericardiophrenic lymphadenopathy has resolved  Leighton Ruff. Redmond Pulling, MD, FACS General, Bariatric, & Minimally Invasive Surgery [facility]   Greer Pickerel 10/09/2018, 8:10 PM

## 2018-10-09 NOTE — Consult Note (Signed)
Reason for Consult:right side abdominal pain and prolonged vaginal bleeding, ovarian cyst Referring Physician: Nettie Elm, PA-C  Annette Conway is an 28 y.o. female. [id number]  No LMP recorded. She has had 10 days of heavier than normal vaginal bleeding with pain that has increased and is now constant and it is painful to breath or move. She has decreased appetite but she would eat if allowed now. No nausea or vomiting. CT showed non specific appendiceal and RLQ changed and 3 cm possible hemorrhagic ovarian cyst, small amount of pelvic fluid, normal uterus, no torsion.  Pertinent Gynecological History: Menses: flow is excessive with use of 8 pads or tampons on heaviest days Bleeding: dysfunctional uterine bleeding Contraception: tubal ligation OB History: G3, P2, A1   Menstrual History:  No LMP recorded.    Past Medical History:  Diagnosis Date  . Headache(784.0)   . UTI (urinary tract infection)   . Vaginal Pap smear, abnormal     Past Surgical History:  Procedure Laterality Date  . CESAREAN SECTION      Family History  Problem Relation Age of Onset  . Diabetes Mother   . Diabetes Maternal Grandmother   . Diabetes Paternal Grandmother   . Anesthesia problems Neg Hx   . Hypotension Neg Hx   . Malignant hyperthermia Neg Hx   . Pseudochol deficiency Neg Hx     Social History:  reports that she has been smoking cigarettes. She has a 1.00 pack-year smoking history. She has never used smokeless tobacco. She reports current drug use. Drug: Marijuana. She reports that she does not drink alcohol.  Allergies: No Known Allergies  Medications: I have reviewed the patient's current medications. Prior to Admission: (Not in a hospital admission)   Review of Systems  Constitutional: Negative.   Respiratory:       Hurts to take deep breath  Cardiovascular: Negative.   Gastrointestinal: Negative for diarrhea, nausea and vomiting.       RUQ and RLQ pain    Blood pressure  117/84, pulse 77, temperature 98.2 F (36.8 C), temperature source Oral, resp. rate 17, SpO2 100 %, unknown if currently breastfeeding. Physical Exam  Vitals reviewed. Constitutional: She is oriented to person, place, and time. She appears well-developed. She appears distressed.  Cardiovascular: Normal rate.  Respiratory: Effort normal.  GI: Soft. She exhibits no distension and no mass. There is abdominal tenderness (mild no guarding right side). There is no rebound and no guarding.  Musculoskeletal: Normal range of motion.  Neurological: She is alert and oriented to person, place, and time.  Skin: Skin is warm and dry.  Psychiatric:  Tearful and anxious at times    Results for orders placed or performed during the hospital encounter of 10/09/18 (from the past 48 hour(s))  POC Urine Pregnancy, ED (not at [facility])     Status: None   Collection Time: 10/09/18  7:53 AM  Result Value Ref Range   Preg Test, Ur NEGATIVE NEGATIVE    Comment:        THE SENSITIVITY OF THIS METHODOLOGY IS >24 mIU/mL   CBC with Differential/Platelet     Status: Abnormal   Collection Time: 10/09/18  9:20 AM  Result Value Ref Range   WBC 7.4 4.0 - 10.5 K/uL   RBC 3.78 (L) 3.87 - 5.11 MIL/uL   Hemoglobin 11.6 (L) 12.0 - 15.0 g/dL   HCT 35.1 (L) 36.0 - 46.0 %   MCV 92.9 80.0 - 100.0 fL   MCH 30.7 26.0 -  34.0 pg   MCHC 33.0 30.0 - 36.0 g/dL   RDW 13.0 11.5 - 15.5 %   Platelets 330 150 - 400 K/uL   nRBC 0.0 0.0 - 0.2 %   Neutrophils Relative % 53 %   Neutro Abs 4.0 1.7 - 7.7 K/uL   Lymphocytes Relative 26 %   Lymphs Abs 1.9 0.7 - 4.0 K/uL   Monocytes Relative 7 %   Monocytes Absolute 0.5 0.1 - 1.0 K/uL   Eosinophils Relative 13 %   Eosinophils Absolute 0.9 (H) 0.0 - 0.5 K/uL   Basophils Relative 1 %   Basophils Absolute 0.0 0.0 - 0.1 K/uL   WBC Morphology See Note     Comment: Vaculated Neutrophils   Immature Granulocytes 0 %   Abs Immature Granulocytes 0.01 0.00 - 0.07 K/uL    Comment: Performed at  [facility], [place] [address], [place], [place] [address]  Wet prep, genital     Status: Abnormal   Collection Time: 10/09/18  9:40 AM   Specimen: Cervical/Vaginal swab  Result Value Ref Range   Yeast Wet Prep HPF POC PRESENT (A) NONE SEEN   Trich, Wet Prep NONE SEEN NONE SEEN   Clue Cells Wet Prep HPF POC NONE SEEN NONE SEEN   WBC, Wet Prep HPF POC RARE (A) NONE SEEN   Sperm NONE SEEN     Comment: Performed at [facility], [place] [address], [place], [place] [address]  Urinalysis, Routine w reflex microscopic     Status: Abnormal   Collection Time: 10/09/18 10:30 AM  Result Value Ref Range   Color, Urine YELLOW YELLOW   APPearance CLEAR CLEAR   Specific Gravity, Urine 1.013 1.005 - 1.030   pH 8.0 5.0 - 8.0   Glucose, UA NEGATIVE NEGATIVE mg/dL   Hgb urine dipstick LARGE (A) NEGATIVE   Bilirubin Urine NEGATIVE NEGATIVE   Ketones, ur NEGATIVE NEGATIVE mg/dL   Protein, ur NEGATIVE NEGATIVE mg/dL   Nitrite NEGATIVE NEGATIVE   Leukocytes,Ua NEGATIVE NEGATIVE   RBC / HPF 11-20 0 - 5 RBC/hpf   WBC, UA 0-5 0 - 5 WBC/hpf   Bacteria, UA RARE (A) NONE SEEN   Squamous Epithelial / LPF 0-5 0 - 5    Comment: Performed at [facility], [place] [address], [place], [place] [address]  Comprehensive metabolic panel     Status: Abnormal   Collection Time: 10/09/18 11:35 AM  Result Value Ref Range   Sodium 138 135 - 145 mmol/L   Potassium 4.0 3.5 - 5.1 mmol/L   Chloride 106 98 - 111 mmol/L   CO2 24 22 - 32 mmol/L   Glucose, Bld 86 70 - 99 mg/dL   BUN 6 6 - 20 mg/dL   Creatinine, Ser 0.81 0.44 - 1.00 mg/dL   Calcium 8.6 (L) 8.9 - 10.3 mg/dL   Total Protein 6.8 6.5 - 8.1 g/dL   Albumin 3.3 (L) 3.5 - 5.0 g/dL   AST 25 15 - 41 U/L   ALT 24 0 - 44 U/L   Alkaline Phosphatase 68 38 - 126 U/L   Total Bilirubin 0.4 0.3 - 1.2 mg/dL   GFR calc non Af Amer >60 >60 mL/min   GFR calc Af Amer >60 >60 mL/min   Anion gap 8 5 - 15    Comment: Performed at [place] [address] [place], [place] [address]  Lipase, blood     Status: None   Collection  Time: 10/09/18 11:35 AM  Result Value Ref Range   Lipase 39 11 - 51 U/L    Comment: Performed at [facility] [address], [place], [place] [address]  SARS Coronavirus 2 [facility] order, Performed in [facility] hospital lab) Nasopharyngeal Nasopharyngeal Swab     Status: None   Collection Time: 10/09/18  2:53 PM   Specimen: Nasopharyngeal Swab  Result Value Ref Range   SARS Coronavirus 2 NEGATIVE NEGATIVE    Comment: (NOTE) If result is NEGATIVE SARS-CoV-2 target nucleic acids are NOT DETECTED. The SARS-CoV-2 RNA is generally detectable in upper and lower  respiratory specimens during the acute phase of infection. The lowest  concentration of SARS-CoV-2 viral copies this assay can detect is 250  copies / mL. A negative result does not preclude SARS-CoV-2 infection  and should not be used as the sole basis for treatment or other  patient management decisions.  A negative result may occur with  improper specimen collection / handling, submission of specimen other  than nasopharyngeal swab, presence of viral mutation(s) within the  areas targeted by this assay, and inadequate number of viral copies  (<250 copies / mL). A negative result must be combined with clinical  observations, patient history, and epidemiological information. If result is POSITIVE SARS-CoV-2 target nucleic acids are DETECTED. The SARS-CoV-2 RNA is generally detectable in upper and lower  respiratory specimens dur ing the acute phase of infection.  Positive  results are indicative of active infection with SARS-CoV-2.  Clinical  correlation with patient history and other diagnostic information is  necessary to determine patient infection status.  Positive results do  not rule out bacterial infection or co-infection with other viruses. If result is PRESUMPTIVE POSTIVE SARS-CoV-2 nucleic acids MAY BE PRESENT.   A presumptive positive  result was obtained on the submitted specimen  and confirmed on repeat testing.  While 2019 novel coronavirus  (SARS-CoV-2) nucleic acids may be present in the submitted sample  additional confirmatory testing may be necessary for epidemiological  and / or clinical management purposes  to differentiate between  SARS-CoV-2 and other Sarbecovirus currently known to infect humans.  If clinically indicated additional testing with an alternate test  methodology [phone]) is advised. The SARS-CoV-2 RNA is generally  detectable in upper and lower respiratory sp ecimens during the acute  phase of infection. The expected result is Negative. Fact Sheet for Patients:  StrictlyIdeas.no Fact Sheet for Healthcare Providers: BankingDealers.co.za This test is not yet approved or cleared by the [place] FDA and has been authorized for detection and/or diagnosis of SARS-CoV-2 by FDA under an Emergency Use Authorization (EUA).  This EUA will remain in effect (meaning this test can be used) for the duration of the COVID-19 declaration under Section 564(b)(1) of the Act, 21 U.S.C. section 360bbb-3(b)(1), unless the authorization is terminated or revoked sooner. Performed at [facility], [place] [address] [place], [place] [address]   I-Stat beta hCG blood, ED     Status: None   Collection Time: 10/09/18  3:12 PM  Result Value Ref Range   I-stat hCG, quantitative <5.0 <5 mIU/mL   Comment 3            Comment:   GEST. AGE      CONC.  (mIU/mL)   <=1 WEEK        5 - 50     2 WEEKS       50 - 500     3  WEEKS       100 - 10,000     4 WEEKS     1,000 - 30,000        FEMALE AND NON-PREGNANT FEMALE:     LESS THAN 5 mIU/mL     US Transvaginal Non-ob  Result Date: 10/09/2018 CLINICAL DATA:  Abnormal CT scan. EXAM: TRANSABDOMINAL AND TRANSVAGINAL ULTRASOUND OF PELVIS DOPPLER ULTRASOUND OF OVARIES TECHNIQUE: Both transabdominal and transvaginal ultrasound  examinations of the pelvis were performed. Transabdominal technique was performed for global imaging of the pelvis including uterus, ovaries, adnexal regions, and pelvic cul-de-sac. It was necessary to proceed with endovaginal exam following the transabdominal exam to visualize the ovaries. Color and duplex Doppler ultrasound was utilized to evaluate blood flow to the ovaries. COMPARISON:  CT from same day FINDINGS: Uterus Measurements: 9.4 x 3.4 x 4.5 cm = volume: 75 mL. No fibroids or other mass visualized. Endometrium Thickness: 4 mm.  No focal abnormality visualized. Right ovary Measurements: 4.8 x 3.5 x 4.1 cm = volume: 36 mL. There is a complex appearing 3.1 x 2.9 x 2.9 cm cystic structure. Left ovary Measurements: 2.6 x 1.9 x 2.9 cm = volume: 7.2 mL. Normal appearance/no adnexal mass. Pulsed Doppler evaluation of both ovaries demonstrates normal waveforms involving the right ovary. The left-sided waveforms were suboptimal which is likely secondary to technique. Other findings There is a small amount of free fluid about the right ovary. IMPRESSION: 1. Probable 3.1 cm right ovarian hemorrhagic cyst. 2. Small amount of free fluid in the pelvis. 3. No convincing Doppler evidence for ovarian torsion, however the waveforms were somewhat suboptimal involving the left ovary as detailed above. Electronically Signed   By: Constance Holster M.D.   On: 10/09/2018 16:13   US Pelvis Complete  Result Date: 10/09/2018 CLINICAL DATA:  Abnormal CT scan. EXAM: TRANSABDOMINAL AND TRANSVAGINAL ULTRASOUND OF PELVIS DOPPLER ULTRASOUND OF OVARIES TECHNIQUE: Both transabdominal and transvaginal ultrasound examinations of the pelvis were performed. Transabdominal technique was performed for global imaging of the pelvis including uterus, ovaries, adnexal regions, and pelvic cul-de-sac. It was necessary to proceed with endovaginal exam following the transabdominal exam to visualize the ovaries. Color and duplex Doppler ultrasound  was utilized to evaluate blood flow to the ovaries. COMPARISON:  CT from same day FINDINGS: Uterus Measurements: 9.4 x 3.4 x 4.5 cm = volume: 75 mL. No fibroids or other mass visualized. Endometrium Thickness: 4 mm.  No focal abnormality visualized. Right ovary Measurements: 4.8 x 3.5 x 4.1 cm = volume: 36 mL. There is a complex appearing 3.1 x 2.9 x 2.9 cm cystic structure. Left ovary Measurements: 2.6 x 1.9 x 2.9 cm = volume: 7.2 mL. Normal appearance/no adnexal mass. Pulsed Doppler evaluation of both ovaries demonstrates normal waveforms involving the right ovary. The left-sided waveforms were suboptimal which is likely secondary to technique. Other findings There is a small amount of free fluid about the right ovary. IMPRESSION: 1. Probable 3.1 cm right ovarian hemorrhagic cyst. 2. Small amount of free fluid in the pelvis. 3. No convincing Doppler evidence for ovarian torsion, however the waveforms were somewhat suboptimal involving the left ovary as detailed above. Electronically Signed   By: Constance Holster M.D.   On: 10/09/2018 16:13   Ct Abdomen Pelvis W Contrast  Result Date: 10/09/2018 CLINICAL DATA:  Right abdominal pain and heavy vaginal bleeding for 10 days with passage of clots. EXAM: CT ABDOMEN AND PELVIS WITH CONTRAST TECHNIQUE: Multidetector CT imaging of the abdomen and pelvis was performed  using the standard protocol following bolus administration of intravenous contrast. CONTRAST:  124mL OMNIPAQUE IOHEXOL 300 MG/ML  SOLN COMPARISON:  02/07/2010 CT abdomen/pelvis. FINDINGS: Lower chest: No significant pulmonary nodules or acute consolidative airspace disease. New mild bilateral pericardiophrenic adenopathy, largest 1.3 cm on the right (series 3/image 14). Hepatobiliary: Normal liver size. No liver mass. No radiopaque cholelithiasis or definite gallbladder wall thickening. No biliary ductal dilatation. Small amount of free fluid in the hepatorenal space. Pancreas: Normal, with no mass or  duct dilation. Spleen: Normal size. No mass. Adrenals/Urinary Tract: Normal adrenals. Normal kidneys with no hydronephrosis and no renal mass. No definite bladder wall thickening accounting for relatively under distended state. Stomach/Bowel: Normal non-distended stomach. Normal caliber small bowel with no small bowel wall thickening. The mid to distal appendix is mildly dilated (8 mm diameter) and mildly thick-walled, without significant periappendiceal fat stranding. No calcified appendicoliths. Normal large bowel with no diverticulosis or large bowel wall thickening. Vascular/Lymphatic: Normal caliber abdominal aorta. Patent portal, splenic, hepatic and renal veins. No pathologically enlarged lymph nodes in the abdomen or pelvis. Reproductive: Grossly normal uterus. Hypodense 3.1 cm right adnexal lesion (series 3/image 70). No left adnexal mass. Other: No pneumoperitoneum. Small volume mildly dense free fluid in the right lower quadrant with associated mild peritoneal thickening and enhancement (series 3/image 71). No focal fluid collection. Musculoskeletal: No aggressive appearing focal osseous lesions. IMPRESSION: 1. Mid to distal appendix is mildly dilated and mildly thick walled without significant periappendiceal fat stranding. Small volume dense free fluid in the right lower quadrant and hepatorenal space with associated mild peritoneal thickening and enhancement suggesting peritonitis. Nonspecific hypodense 3.1 cm right adnexal lesion, statistically most commonly a hemorrhagic right ovarian cyst. These findings are nonspecific and could be secondary to acute appendicitis or could be reactive to ruptured right ovarian hemorrhagic cyst. Surgical consultation advised. Pelvic ultrasound may be helpful for further evaluation. 2. No free air.  No focal/drainable fluid collection. 3. Mild bilateral pericardiophrenic lymphadenopathy, new since 2011 CT, nonspecific. Suggest follow-up chest CT with IV contrast in 3  months. Electronically Signed   By: Ilona Sorrel M.D.   On: 10/09/2018 13:17   [place] Art/ven Flow Abd Pelv Doppler  Result Date: 10/09/2018 CLINICAL DATA:  Abnormal CT scan. EXAM: TRANSABDOMINAL AND TRANSVAGINAL ULTRASOUND OF PELVIS DOPPLER ULTRASOUND OF OVARIES TECHNIQUE: Both transabdominal and transvaginal ultrasound examinations of the pelvis were performed. Transabdominal technique was performed for global imaging of the pelvis including uterus, ovaries, adnexal regions, and pelvic cul-de-sac. It was necessary to proceed with endovaginal exam following the transabdominal exam to visualize the ovaries. Color and duplex Doppler ultrasound was utilized to evaluate blood flow to the ovaries. COMPARISON:  CT from same day FINDINGS: Uterus Measurements: 9.4 x 3.4 x 4.5 cm = volume: 75 mL. No fibroids or other mass visualized. Endometrium Thickness: 4 mm.  No focal abnormality visualized. Right ovary Measurements: 4.8 x 3.5 x 4.1 cm = volume: 36 mL. There is a complex appearing 3.1 x 2.9 x 2.9 cm cystic structure. Left ovary Measurements: 2.6 x 1.9 x 2.9 cm = volume: 7.2 mL. Normal appearance/no adnexal mass. Pulsed Doppler evaluation of both ovaries demonstrates normal waveforms involving the right ovary. The left-sided waveforms were suboptimal which is likely secondary to technique. Other findings There is a small amount of free fluid about the right ovary. IMPRESSION: 1. Probable 3.1 cm right ovarian hemorrhagic cyst. 2. Small amount of free fluid in the pelvis. 3. No convincing Doppler evidence for ovarian torsion, however the  waveforms were somewhat suboptimal involving the left ovary as detailed above. Electronically Signed   By: Constance Holster M.D.   On: 10/09/2018 16:13    Assessment/Plan: Right side abdominal pain DUB with normal uterus and left adnexa, 3 cm possible hemorrhagic cyst. Right ovarian cyst would not be likely to cause her current symptoms and I would not offer diagnostic surgery as  there is no torsion. Her DUB can be managed with Provera 20 mg daily for 20 days and f/u with [facility] as an outpatient. I sent a message for them to schedule follow-up. Non-specific appendiceal changes, normal WBC and no fever makes diagnosis of appendicitis difficult  Emeterio Reeve 10/09/2018

## 2018-10-10 NOTE — ED Notes (Signed)
Medication wasted in [facility]. Was not able to waste same in pyxis. Waste witnessed by Myna Hidalgo, RN.

## 2018-10-11 LAB — GC/CHLAMYDIA PROBE AMP (~~LOC~~) NOT AT ARMC
Chlamydia: POSITIVE — AB
Neisseria Gonorrhea: NEGATIVE

## 2018-10-26 ENCOUNTER — Ambulatory Visit: Payer: Medicaid Other | Admitting: Obstetrics & Gynecology

## 2018-11-09 ENCOUNTER — Telehealth: Payer: Self-pay | Admitting: Obstetrics & Gynecology

## 2018-12-15 ENCOUNTER — Ambulatory Visit: Payer: Medicaid Other | Admitting: Obstetrics and Gynecology

## 2019-01-09 ENCOUNTER — Other Ambulatory Visit: Payer: Self-pay

## 2019-01-09 DIAGNOSIS — Z20822 Contact with and (suspected) exposure to covid-19: Secondary | ICD-10-CM

## 2019-01-10 LAB — NOVEL CORONAVIRUS, NAA: SARS-CoV-2, NAA: NOT DETECTED

## 2019-02-28 ENCOUNTER — Ambulatory Visit: Payer: Medicaid Other | Admitting: Obstetrics and Gynecology

## 2019-03-14 ENCOUNTER — Ambulatory Visit: Payer: Medicaid Other | Admitting: Obstetrics and Gynecology

## 2019-05-25 ENCOUNTER — Encounter (HOSPITAL_COMMUNITY): Payer: Self-pay

## 2019-05-25 ENCOUNTER — Other Ambulatory Visit: Payer: Self-pay

## 2019-05-25 ENCOUNTER — Ambulatory Visit (HOSPITAL_COMMUNITY)
Admission: EM | Admit: 2019-05-25 | Discharge: 2019-05-25 | Disposition: A | Discharge status: Home / Self Care | Payer: Medicaid Other | Attending: Internal Medicine | Admitting: Internal Medicine

## 2019-05-25 DIAGNOSIS — Z3202 Encounter for pregnancy test, result negative: Secondary | ICD-10-CM

## 2019-05-25 DIAGNOSIS — N3001 Acute cystitis with hematuria: Secondary | ICD-10-CM | POA: Diagnosis present

## 2019-05-25 LAB — POCT URINALYSIS DIP (DEVICE)
Bilirubin Urine: NEGATIVE
Glucose, UA: NEGATIVE mg/dL
Ketones, ur: NEGATIVE mg/dL
Nitrite: NEGATIVE
Protein, ur: 100 mg/dL — AB
Specific Gravity, Urine: 1.02 (ref 1.005–1.030)
Urobilinogen, UA: 1 mg/dL (ref 0.0–1.0)
pH: 7.5 (ref 5.0–8.0)

## 2019-05-25 LAB — POC URINE PREG, ED: Preg Test, Ur: NEGATIVE

## 2019-05-25 LAB — POCT PREGNANCY, URINE: Preg Test, Ur: NEGATIVE

## 2019-05-25 MED ORDER — PHENAZOPYRIDINE HCL 200 MG PO TABS: 200.0000 mg | ORAL_TABLET | Freq: Three times a day (TID) | ORAL | 0 refills | Status: DC

## 2019-05-25 MED ORDER — NITROFURANTOIN MONOHYD MACRO 100 MG PO CAPS: 100.0000 mg | ORAL_CAPSULE | Freq: Two times a day (BID) | ORAL | 0 refills | Status: DC

## 2019-05-25 NOTE — ED Triage Notes (Signed)
Pt presents with lower pelvic pain, urinary discomfort and frequency X 2 days.

## 2019-05-25 NOTE — Discharge Instructions (Addendum)
Treating you for a urinary tract infection Take the medication as prescribed.  Pyridium for discomfort as needed Make sure you are drinking plenty of water to flush out the bladder and stay hydrated. Follow up as needed for continued or worsening symptoms

## 2019-05-26 LAB — URINE CULTURE: Culture: NO GROWTH

## 2019-05-26 NOTE — ED Provider Notes (Signed)
[facility]    CSN: [id number] Arrival date & time: 05/25/19  [id number]      History   Chief Complaint Chief Complaint  Patient presents with  . Urinary Tract Infection    HPI Annette Conway is a 29 y.o. female.   Patient is a 29 year old female presents today with lower pelvic discomfort, dysuria and urinary frequency x2 days.  Symptoms have been constant.  She has not taken any medication for her symptoms.  History of UTI and feels similar.  Denies any vaginal discharge, itching or irritation. Patient's last menstrual period was 05/07/2019.  ROS per HPI      Past Medical History:  Diagnosis Date  . Headache(784.0)   . UTI (urinary tract infection)   . Vaginal Pap smear, abnormal     Patient Active Problem List   Diagnosis Date Noted  . Vaginal yeast infection 07/25/2013  . History of C-section 06/23/2013    Past Surgical History:  Procedure Laterality Date  . CESAREAN SECTION      OB History    Gravida  3   Para  2   Term  2   Preterm      AB  1   Living  1     SAB      TAB      Ectopic  1   Multiple      Live Births  2            Home Medications    Prior to Admission medications   Medication Sig Start Date End Date Taking? Authorizing Provider  medroxyPROGESTERone (PROVERA) 10 MG tablet Take 2 tablets (20 mg total) by mouth daily. 10/09/18   Woodroe Mode, MD  nitrofurantoin, macrocrystal-monohydrate, (MACROBID) 100 MG capsule Take 1 capsule (100 mg total) by mouth 2 (two) times daily. 05/25/19   Loura Halt A, NP  phenazopyridine (PYRIDIUM) 200 MG tablet Take 1 tablet (200 mg total) by mouth 3 (three) times daily. 05/25/19   Orvan July, NP    Family History Family History  Problem Relation Age of Onset  . Diabetes Mother   . Diabetes Maternal Grandmother   . Diabetes Paternal Grandmother   . Anesthesia problems Neg Hx   . Hypotension Neg Hx   . Malignant hyperthermia Neg Hx   . Pseudochol deficiency Neg Hx      Social History Social History   Tobacco Use  . Smoking status: Current Every Day Smoker    Packs/day: 0.25    Years: 4.00    Pack years: 1.00    Types: Cigarettes    Last attempt to quit: 02/26/2011    Years since quitting: 8.2  . Smokeless tobacco: Never Used  Substance Use Topics  . Alcohol use: No  . Drug use: Yes    Types: Marijuana    Comment: trying to stop     Allergies   Patient has no known allergies.   Review of Systems Review of Systems   Physical Exam Triage Vital Signs ED Triage Vitals  Enc Vitals Group     BP 05/25/19 0852 (!) 136/92     Pulse Rate 05/25/19 0852 79     Resp 05/25/19 0852 17     Temp 05/25/19 0852 98.3 F (36.8 C)     Temp Source 05/25/19 0852 Oral     SpO2 05/25/19 0852 99 %     Weight --      Height --      Head  Circumference --      Peak Flow --      Pain Score 05/25/19 0853 7     Pain Loc --      Pain Edu? --      Excl. in [place]? --    No data found.  Updated Vital Signs BP (!) 136/92 (BP Location: Right Arm)   Pulse 79   Temp 98.3 F (36.8 C) (Oral)   Resp 17   LMP 05/07/2019   SpO2 99%   Visual Acuity Right Eye Distance:   Left Eye Distance:   Bilateral Distance:    Right Eye Near:   Left Eye Near:    Bilateral Near:     Physical Exam Vitals and nursing note reviewed.  Constitutional:      General: She is not in acute distress.    Appearance: Normal appearance. She is not ill-appearing, toxic-appearing or diaphoretic.  HENT:     Head: Normocephalic.     Nose: Nose normal.  Eyes:     Conjunctiva/sclera: Conjunctivae normal.  Pulmonary:     Effort: Pulmonary effort is normal.  Musculoskeletal:        General: Normal range of motion.     Cervical back: Normal range of motion.  Skin:    General: Skin is warm and dry.     Findings: No rash.  Neurological:     Mental Status: She is alert.  Psychiatric:        Mood and Affect: Mood normal.      UC Treatments / Results  Labs (all labs ordered  are listed, but only abnormal results are displayed) Labs Reviewed  POCT URINALYSIS DIP (DEVICE) - Abnormal; Notable for the following components:      Result Value   Hgb urine dipstick MODERATE (*)    Protein, ur 100 (*)    Leukocytes,Ua SMALL (*)    All other components within normal limits  URINE CULTURE  POC URINE PREG, ED  POCT PREGNANCY, URINE    EKG   Radiology No results found.  Procedures Procedures (including critical care time)  Medications Ordered in UC Medications - No data to display  Initial Impression / Assessment and Plan / UC Course  I have reviewed the triage vital signs and the nursing notes.  Pertinent labs & imaging results that were available during my care of the patient were reviewed by me and considered in my medical decision making (see chart for details).     Acute cystitis with hematuria Urine with small leuks and moderate hemoglobin. Based on symptoms and urine sample will go ahead and treat for urinary tract infection based on history. Treating with Macrobid Pyridium for urinary discomfort Push fluids Follow up as needed for continued or worsening symptoms  Final Clinical Impressions(s) / UC Diagnoses   Final diagnoses:  Acute cystitis with hematuria     Discharge Instructions     Treating you for a urinary tract infection Take the medication as prescribed.  Pyridium for discomfort as needed Make sure you are drinking plenty of water to flush out the bladder and stay hydrated. Follow up as needed for continued or worsening symptoms     ED Prescriptions    Medication Sig Dispense Auth. Provider   nitrofurantoin, macrocrystal-monohydrate, (MACROBID) 100 MG capsule Take 1 capsule (100 mg total) by mouth 2 (two) times daily. 10 capsule Nilton Lave A, NP   phenazopyridine (PYRIDIUM) 200 MG tablet Take 1 tablet (200 mg total) by mouth 3 (three) times  daily. 6 tablet Loura Halt A, NP     PDMP not reviewed this encounter.   Orvan July, NP 05/27/19 [phone]

## 2019-07-17 ENCOUNTER — Emergency Department (HOSPITAL_BASED_OUTPATIENT_CLINIC_OR_DEPARTMENT_OTHER)
Admission: EM | Admit: 2019-07-17 | Discharge: 2019-07-17 | Disposition: A | Discharge status: Home / Self Care | Payer: Medicaid Other | Attending: Emergency Medicine | Admitting: Emergency Medicine

## 2019-07-17 ENCOUNTER — Other Ambulatory Visit: Payer: Self-pay

## 2019-07-17 ENCOUNTER — Encounter (HOSPITAL_COMMUNITY): Payer: Self-pay | Admitting: Emergency Medicine

## 2019-07-17 ENCOUNTER — Ambulatory Visit (HOSPITAL_COMMUNITY)
Admission: EM | Admit: 2019-07-17 | Discharge: 2019-07-17 | Disposition: A | Discharge status: Home / Self Care | Payer: Medicaid Other

## 2019-07-17 ENCOUNTER — Encounter (HOSPITAL_BASED_OUTPATIENT_CLINIC_OR_DEPARTMENT_OTHER): Payer: Self-pay

## 2019-07-17 ENCOUNTER — Emergency Department (HOSPITAL_BASED_OUTPATIENT_CLINIC_OR_DEPARTMENT_OTHER): Payer: Medicaid Other

## 2019-07-17 DIAGNOSIS — R55 Syncope and collapse: Secondary | ICD-10-CM | POA: Insufficient documentation

## 2019-07-17 DIAGNOSIS — H53142 Visual discomfort, left eye: Secondary | ICD-10-CM | POA: Diagnosis not present

## 2019-07-17 DIAGNOSIS — R220 Localized swelling, mass and lump, head: Secondary | ICD-10-CM

## 2019-07-17 DIAGNOSIS — Y9259 Other trade areas as the place of occurrence of the external cause: Secondary | ICD-10-CM | POA: Diagnosis not present

## 2019-07-17 DIAGNOSIS — W500XXA Accidental hit or strike by another person, initial encounter: Secondary | ICD-10-CM | POA: Insufficient documentation

## 2019-07-17 DIAGNOSIS — S02832A Fracture of medial orbital wall, left side, initial encounter for closed fracture: Secondary | ICD-10-CM | POA: Diagnosis not present

## 2019-07-17 DIAGNOSIS — S0592XA Unspecified injury of left eye and orbit, initial encounter: Secondary | ICD-10-CM

## 2019-07-17 DIAGNOSIS — R040 Epistaxis: Secondary | ICD-10-CM | POA: Diagnosis not present

## 2019-07-17 DIAGNOSIS — H05232 Hemorrhage of left orbit: Secondary | ICD-10-CM | POA: Diagnosis not present

## 2019-07-17 DIAGNOSIS — Y999 Unspecified external cause status: Secondary | ICD-10-CM | POA: Diagnosis not present

## 2019-07-17 DIAGNOSIS — Z87891 Personal history of nicotine dependence: Secondary | ICD-10-CM | POA: Insufficient documentation

## 2019-07-17 DIAGNOSIS — F1210 Cannabis abuse, uncomplicated: Secondary | ICD-10-CM | POA: Insufficient documentation

## 2019-07-17 DIAGNOSIS — H05239 Hemorrhage of unspecified orbit: Secondary | ICD-10-CM

## 2019-07-17 DIAGNOSIS — Y9389 Activity, other specified: Secondary | ICD-10-CM | POA: Insufficient documentation

## 2019-07-17 DIAGNOSIS — H5712 Ocular pain, left eye: Secondary | ICD-10-CM | POA: Diagnosis not present

## 2019-07-17 DIAGNOSIS — S0990XA Unspecified injury of head, initial encounter: Secondary | ICD-10-CM | POA: Diagnosis present

## 2019-07-17 DIAGNOSIS — S0285XA Fracture of orbit, unspecified, initial encounter for closed fracture: Secondary | ICD-10-CM

## 2019-07-17 DIAGNOSIS — S0993XA Unspecified injury of face, initial encounter: Secondary | ICD-10-CM

## 2019-07-17 MED ORDER — TETRACAINE HCL 0.5 % OP SOLN
OPHTHALMIC | Status: AC
  Filled 2019-07-17: qty 4

## 2019-07-17 MED ORDER — HYDROCODONE-ACETAMINOPHEN 5-325 MG PO TABS
1.0000 | ORAL_TABLET | Freq: Once | ORAL | Status: AC
  Administered 2019-07-17: 1 via ORAL
  Filled 2019-07-17: qty 1

## 2019-07-17 MED ORDER — FLUORESCEIN SODIUM 1 MG OP STRP
ORAL_STRIP | OPHTHALMIC | Status: AC
  Filled 2019-07-17: qty 1
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
## 2019-07-17 NOTE — ED Notes (Signed)
Patient transported to CT 
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
## 2019-07-17 NOTE — Discharge Instructions (Addendum)
Thank you for allowing me to care for you today. Please return to the emergency department if you have new or worsening symptoms. Take your medications as instructed.  ° °
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
## 2019-08-05 ENCOUNTER — Emergency Department (HOSPITAL_COMMUNITY)
Admission: EM | Admit: 2019-08-05 | Discharge: 2019-08-06 | Disposition: A | Discharge status: Home / Self Care | Payer: No Typology Code available for payment source | Attending: Emergency Medicine | Admitting: Emergency Medicine

## 2019-08-05 ENCOUNTER — Emergency Department (HOSPITAL_COMMUNITY): Payer: No Typology Code available for payment source

## 2019-08-05 ENCOUNTER — Encounter (HOSPITAL_COMMUNITY): Payer: Self-pay

## 2019-08-05 DIAGNOSIS — Y999 Unspecified external cause status: Secondary | ICD-10-CM | POA: Diagnosis not present

## 2019-08-05 DIAGNOSIS — Z87891 Personal history of nicotine dependence: Secondary | ICD-10-CM | POA: Diagnosis not present

## 2019-08-05 DIAGNOSIS — S43112A Subluxation of left acromioclavicular joint, initial encounter: Secondary | ICD-10-CM | POA: Insufficient documentation

## 2019-08-05 DIAGNOSIS — Y92410 Unspecified street and highway as the place of occurrence of the external cause: Secondary | ICD-10-CM | POA: Insufficient documentation

## 2019-08-05 DIAGNOSIS — R519 Headache, unspecified: Secondary | ICD-10-CM | POA: Insufficient documentation

## 2019-08-05 DIAGNOSIS — Y93I9 Activity, other involving external motion: Secondary | ICD-10-CM | POA: Insufficient documentation

## 2019-08-05 DIAGNOSIS — S43102A Unspecified dislocation of left acromioclavicular joint, initial encounter: Secondary | ICD-10-CM

## 2019-08-05 DIAGNOSIS — S4992XA Unspecified injury of left shoulder and upper arm, initial encounter: Secondary | ICD-10-CM | POA: Diagnosis present

## 2019-08-05 LAB — I-STAT BETA HCG BLOOD, ED (MC, WL, AP ONLY): I-stat hCG, quantitative: <5 m[IU]/mL (ref <5)

## 2019-08-05 MED ORDER — ONDANSETRON HCL 4 MG/2ML IJ SOLN
4.0000 mg | Freq: Once | INTRAMUSCULAR | Status: AC
  Administered 2019-08-05: 4 mg via INTRAVENOUS
  Filled 2019-08-05: qty 2

## 2019-08-05 MED ORDER — TETRACAINE HCL 0.5 % OP SOLN
2.0000 [drp] | Freq: Once | OPHTHALMIC | Status: AC
  Administered 2019-08-05: 2 [drp] via OPHTHALMIC
  Filled 2019-08-05: qty 4

## 2019-08-05 MED ORDER — MORPHINE SULFATE (PF) 4 MG/ML IV SOLN
INTRAVENOUS | Status: AC
  Administered 2019-08-05: 4 mg via INTRAVENOUS
  Filled 2019-08-05: qty 1

## 2019-08-05 MED ORDER — MORPHINE SULFATE (PF) 2 MG/ML IV SOLN
2.0000 mg | Freq: Once | INTRAVENOUS | Status: AC
  Administered 2019-08-06: 2 mg via INTRAVENOUS
  Filled 2019-08-05: qty 1

## 2019-08-05 MED ORDER — MORPHINE SULFATE (PF) 4 MG/ML IV SOLN
4.0000 mg | Freq: Once | INTRAVENOUS | Status: AC
  Filled 2019-08-05: qty 1

## 2019-08-05 MED ORDER — FLUORESCEIN SODIUM 1 MG OP STRP
1.0000 | ORAL_STRIP | Freq: Once | OPHTHALMIC | Status: AC
  Administered 2019-08-05: 1 via OPHTHALMIC
  Filled 2019-08-05: qty 1

## 2019-08-05 MED ORDER — SODIUM CHLORIDE 0.9 % IV BOLUS
500.0000 mL | Freq: Once | INTRAVENOUS | Status: AC
  Administered 2019-08-05: 500 mL via INTRAVENOUS
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
## 2019-08-05 NOTE — ED Notes (Signed)
Pt placed in C Collar .

## 2019-08-05 NOTE — Progress Notes (Signed)
   08/05/19 2130  Clinical Encounter Type  Visited With Patient  Visit Type ED;Trauma  Referral From Family  Consult/Referral To Chaplain   Chaplain was walking to ED lobby when hallway bed pt called out. Chaplain asked what the gentleman needed. Female hallway bed pt asked chaplain to give purse and cell phone to his wife who was also a pt. Chaplain asked the ED Bridge where to locate [facility] and was directed to hallway bed 20. Chaplain delivered purse and cell phone to [place]. Chaplains remain available for support as needs arise.   Chaplain Resident, Evelene Croon, M Div. [phone] on-call pager
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
## 2019-08-06 DIAGNOSIS — S43112A Subluxation of left acromioclavicular joint, initial encounter: Secondary | ICD-10-CM | POA: Diagnosis not present

## 2019-08-06 MED ORDER — HYDROCODONE-ACETAMINOPHEN 5-325 MG PO TABS: 1.0000 | ORAL_TABLET | Freq: Three times a day (TID) | ORAL | 0 refills | Status: DC | PRN

## 2019-08-06 NOTE — Discharge Instructions (Signed)
You may take 600 mg ibuprofen every 6 hours as needed for pain. IF you have breakthrough pain you may take one tablet or Norco every 8 hours. Take medication as directed and do not operate machinery, drive a car, or work while taking this medication as it can make you drowsy.   You were given information of follow-up with an orthopedic doctor, Dr. Ninfa Linden.  Please call the office on Monday to schedule an appointment for follow-up next week.  You should also follow-up with your ophthalmologist in regards to your persistent visual changes.  Please return the emergency department for any new or worsening symptoms.

## 2019-08-06 NOTE — ED Notes (Signed)
Patient verbalizes understanding of discharge instructions. Opportunity for questioning and answers were provided. Armband removed by staff, pt discharged from ED. Pt. ambulatory and discharged home.  

## 2019-08-08 ENCOUNTER — Other Ambulatory Visit: Payer: Self-pay | Admitting: Surgical

## 2019-08-08 ENCOUNTER — Ambulatory Visit (INDEPENDENT_AMBULATORY_CARE_PROVIDER_SITE_OTHER): Payer: Self-pay | Admitting: Orthopedic Surgery

## 2019-08-08 ENCOUNTER — Telehealth: Payer: Self-pay

## 2019-08-08 DIAGNOSIS — S43102A Unspecified dislocation of left acromioclavicular joint, initial encounter: Secondary | ICD-10-CM

## 2019-08-08 MED ORDER — MELOXICAM 15 MG PO TABS: ORAL_TABLET | ORAL | 0 refills | Status: DC

## 2019-08-08 MED ORDER — HYDROCODONE-ACETAMINOPHEN 5-325 MG PO TABS: 1.0000 | ORAL_TABLET | Freq: Two times a day (BID) | ORAL | 0 refills | Status: DC | PRN

## 2019-08-08 MED ORDER — METHOCARBAMOL 500 MG PO TABS: 500.0000 mg | ORAL_TABLET | Freq: Three times a day (TID) | ORAL | 0 refills | Status: DC | PRN

## 2019-08-08 NOTE — Telephone Encounter (Signed)
Patient seen by Dr Marlou Sa today and he wants Norco 5/325 1 po bid #[phone] sent in for her

## 2019-08-08 NOTE — Telephone Encounter (Signed)
submitted

## 2019-08-10 ENCOUNTER — Encounter: Payer: Self-pay | Admitting: Orthopedic Surgery
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
## 2019-09-06 ENCOUNTER — Ambulatory Visit (INDEPENDENT_AMBULATORY_CARE_PROVIDER_SITE_OTHER): Payer: Medicaid Other | Admitting: Orthopedic Surgery

## 2019-09-06 DIAGNOSIS — S43102A Unspecified dislocation of left acromioclavicular joint, initial encounter: Secondary | ICD-10-CM

## 2019-09-06 MED ORDER — HYDROCODONE-ACETAMINOPHEN 5-325 MG PO TABS: 1.0000 | ORAL_TABLET | Freq: Two times a day (BID) | ORAL | 0 refills | Status: AC | PRN

## 2019-09-07 ENCOUNTER — Encounter: Payer: Self-pay | Admitting: Orthopedic Surgery
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
## 2019-09-21 ENCOUNTER — Ambulatory Visit: Payer: Self-pay | Admitting: Physical Therapy

## 2019-11-21 ENCOUNTER — Ambulatory Visit: Payer: Medicaid Other | Admitting: Obstetrics

## 2019-11-22 ENCOUNTER — Ambulatory Visit: Payer: Medicaid Other | Admitting: Orthopedic Surgery

## 2020-02-16 ENCOUNTER — Ambulatory Visit: Payer: Medicaid Other | Admitting: Obstetrics and Gynecology

## 2020-06-14 ENCOUNTER — Ambulatory Visit: Payer: Medicaid Other | Admitting: Physician Assistant

## 2020-07-01 ENCOUNTER — Ambulatory Visit: Payer: Medicaid Other | Admitting: Physician Assistant

## 2020-07-12 ENCOUNTER — Emergency Department (HOSPITAL_COMMUNITY)
Admission: EM | Admit: 2020-07-12 | Discharge: 2020-07-12 | Discharge status: Against Medical Advice | Payer: No Typology Code available for payment source

## 2020-07-31 ENCOUNTER — Other Ambulatory Visit (HOSPITAL_COMMUNITY)
Admission: RE | Admit: 2020-07-31 | Discharge: 2020-07-31 | Disposition: A | Discharge status: Home / Self Care | Payer: Medicaid Other | Source: Ambulatory Visit | Attending: Obstetrics | Admitting: Obstetrics

## 2020-07-31 ENCOUNTER — Ambulatory Visit (INDEPENDENT_AMBULATORY_CARE_PROVIDER_SITE_OTHER): Payer: Medicaid Other | Admitting: Obstetrics

## 2020-07-31 ENCOUNTER — Other Ambulatory Visit: Payer: Self-pay

## 2020-07-31 ENCOUNTER — Encounter: Payer: Self-pay | Admitting: Obstetrics

## 2020-07-31 VITALS — BP 118/79 | HR 73 | Ht 66.0 in | Wt 226.6 lb

## 2020-07-31 DIAGNOSIS — N939 Abnormal uterine and vaginal bleeding, unspecified: Secondary | ICD-10-CM | POA: Diagnosis not present

## 2020-07-31 DIAGNOSIS — R102 Pelvic and perineal pain: Secondary | ICD-10-CM

## 2020-07-31 DIAGNOSIS — N898 Other specified noninflammatory disorders of vagina: Secondary | ICD-10-CM | POA: Insufficient documentation

## 2020-07-31 MED ORDER — DOXYCYCLINE HYCLATE 100 MG PO CAPS: 100.0000 mg | ORAL_CAPSULE | Freq: Two times a day (BID) | ORAL | 0 refills | Status: DC

## 2020-07-31 MED ORDER — METRONIDAZOLE 500 MG PO TABS: 500.0000 mg | ORAL_TABLET | Freq: Two times a day (BID) | ORAL | 2 refills | Status: DC

## 2020-07-31 MED ORDER — OXYCODONE-ACETAMINOPHEN 5-325 MG PO TABS: 1.0000 | ORAL_TABLET | ORAL | 0 refills | Status: DC | PRN

## 2020-07-31 NOTE — Progress Notes (Signed)
Pt reports that since May she has been having 2 menstrual cycles with a week and a half of no bleeding, then bleeding returns for 3-4 days with painful cramps.  Pt reports bleeding started last night and she is bleeding heavy today. Last pap was in [place]

## 2020-07-31 NOTE — Progress Notes (Signed)
Patient ID: Annette Conway, female   DOB: 02/12/1990, 30 y.o.   MRN: [id number]  Chief Complaint  Patient presents with   Annual Exam    HPI Annette Conway is a 30 y.o. female.  Complains of irregular vaginal bleeding since May.  The vaginal bleeding has been heavy at times and associated with severe cramping.  Also c/o vaginal discharge.  Denies dysuria, fever/chills.  Contraception: None HPI  Past Medical History:  Diagnosis Date   Headache(784.0)    UTI (urinary tract infection)    Vaginal Pap smear, abnormal     Past Surgical History:  Procedure Laterality Date   CESAREAN SECTION      Family History  Problem Relation Age of Onset   Diabetes Mother    Diabetes Maternal Grandmother    Diabetes Paternal Grandmother    Anesthesia problems Neg Hx    Hypotension Neg Hx    Malignant hyperthermia Neg Hx    Pseudochol deficiency Neg Hx     Social History Social History   Tobacco Use   Smoking status: Former    Packs/day: 0.25    Years: 4.00    Pack years: 1.00    Types: Cigarettes   Smokeless tobacco: Never  Vaping Use   Vaping Use: Never used  Substance Use Topics   Alcohol use: Yes    Comment: occ   Drug use: Yes    Types: Marijuana    No Known Allergies  Current Outpatient Medications  Medication Sig Dispense Refill   doxycycline (VIBRAMYCIN) 100 MG capsule Take 1 capsule (100 mg total) by mouth 2 (two) times daily. 14 capsule 0   metroNIDAZOLE (FLAGYL) 500 MG tablet Take 1 tablet (500 mg total) by mouth 2 (two) times daily. 14 tablet 2   oxyCODONE-acetaminophen (PERCOCET/ROXICET) 5-325 MG tablet Take 1 tablet by mouth every 4 (four) hours as needed for severe pain. 20 tablet 0   HYDROcodone-acetaminophen (NORCO/VICODIN) 5-325 MG tablet Take 1 tablet by mouth every 12 (twelve) hours as needed for moderate pain. (Patient not taking: Reported on 07/31/2020) 40 tablet 0   medroxyPROGESTERone (PROVERA) 10 MG tablet Take 2 tablets (20 mg total) by mouth daily. (Patient not  taking: Reported on 07/31/2020) 40 tablet 2   meloxicam (MOBIC) 15 MG tablet 1 po q d x 4 weeks (Patient not taking: Reported on 07/31/2020) 30 tablet 0   methocarbamol (ROBAXIN) 500 MG tablet Take 1 tablet (500 mg total) by mouth every 8 (eight) hours as needed for muscle spasms. (Patient not taking: Reported on 07/31/2020) 30 tablet 0   nitrofurantoin, macrocrystal-monohydrate, (MACROBID) 100 MG capsule Take 1 capsule (100 mg total) by mouth 2 (two) times daily. (Patient not taking: No sig reported) 10 capsule 0   phenazopyridine (PYRIDIUM) 200 MG tablet Take 1 tablet (200 mg total) by mouth 3 (three) times daily. (Patient not taking: No sig reported) 6 tablet 0   No current facility-administered medications for this visit.    Review of Systems Review of Systems Constitutional: negative for fatigue and weight loss Respiratory: negative for cough and wheezing Cardiovascular: negative for chest pain, fatigue and palpitations Gastrointestinal: negative for abdominal pain and change in bowel habits Genitourinary: positive for irregular vaginal bleeding with severe cramping Integument/breast: negative for nipple discharge Musculoskeletal:negative for myalgias Neurological: negative for gait problems and tremors Behavioral/Psych: negative for abusive relationship, depression Endocrine: negative for temperature intolerance      Blood pressure 118/79, pulse 73, height 5\' 6"  (1.676 m), weight 226 lb 9.6 oz (102.8  kg), last menstrual period 07/30/2020, unknown if currently breastfeeding.  Physical Exam Physical Exam General:   Alert and no distress  Skin:   no rash or abnormalities  Lungs:   clear to auscultation bilaterally  Heart:   regular rate and rhythm, S1, S2 normal, no murmur, click, rub or gallop  The remainder of the physical exam deferred because of the heavy vaginal bleeding The patient will schedule an appointment in a few weeks for full physical exam and pap smear  I have spent a  total of 15 minutes of face-to-face and time, excluding clinical staff time, reviewing notes and preparing to see patient, ordering tests and/or medications, and counseling the patient.   Data Reviewed Wet prep and cultures  Assessment     1. Pelvic pain Rx: - US PELVIC COMPLETE WITH TRANSVAGINAL; Future - oxyCODONE-acetaminophen (PERCOCET/ROXICET) 5-325 MG tablet; Take 1 tablet by mouth every 4 (four) hours as needed for severe pain.  Dispense: 20 tablet; Refill: 0  2. Abnormal uterine bleeding (AUB) Rx: - US PELVIC COMPLETE WITH TRANSVAGINAL; Future  3. Vaginal discharge.  Possible PID Rx: - doxycycline (VIBRAMYCIN) 100 MG capsule; Take 1 capsule (100 mg total) by mouth 2 (two) times daily.  Dispense: 14 capsule; Refill: 0 - metroNIDAZOLE (FLAGYL) 500 MG tablet; Take 1 tablet (500 mg total) by mouth 2 (two) times daily.  Dispense: 14 tablet; Refill: 2    Plan   Follow up in 2 weeks  Orders Placed This Encounter  Procedures   US PELVIC COMPLETE WITH TRANSVAGINAL    Standing Status:   Future    Standing Expiration Date:   07/31/2021    Order Specific Question:   Reason for Exam (SYMPTOM  OR DIAGNOSIS REQUIRED)    Answer:   AUB.  Pelvic pain    Order Specific Question:   Preferred imaging location?    Answer:   [facility] ordered this encounter  Medications   doxycycline (VIBRAMYCIN) 100 MG capsule    Sig: Take 1 capsule (100 mg total) by mouth 2 (two) times daily.    Dispense:  14 capsule    Refill:  0   metroNIDAZOLE (FLAGYL) 500 MG tablet    Sig: Take 1 tablet (500 mg total) by mouth 2 (two) times daily.    Dispense:  14 tablet    Refill:  2   oxyCODONE-acetaminophen (PERCOCET/ROXICET) 5-325 MG tablet    Sig: Take 1 tablet by mouth every 4 (four) hours as needed for severe pain.    Dispense:  20 tablet    Refill:  0       Shelly Bombard, MD 07/31/2020 2:16 PM

## 2020-07-31 NOTE — Addendum Note (Signed)
Addended by: Tristan Schroeder D on: 07/31/2020 04:55 PM   Modules accepted: Orders

## 2020-08-01 LAB — CERVICOVAGINAL ANCILLARY ONLY
Bacterial Vaginitis (gardnerella): POSITIVE — AB
Candida Glabrata: NEGATIVE
Candida Vaginitis: NEGATIVE
Chlamydia: NEGATIVE
Comment: NEGATIVE
Comment: NEGATIVE
Comment: NEGATIVE
Comment: NEGATIVE
Comment: NEGATIVE
Comment: NORMAL
Neisseria Gonorrhea: NEGATIVE
Trichomonas: NEGATIVE

## 2020-08-02 ENCOUNTER — Other Ambulatory Visit: Payer: Self-pay | Admitting: Obstetrics

## 2020-08-05 ENCOUNTER — Telehealth: Payer: Self-pay

## 2020-08-05 NOTE — Telephone Encounter (Signed)
-----   Message from Shelly Bombard, MD sent at 08/02/2020 12:51 PM EDT ----- Flagyl Rx for BV

## 2020-08-05 NOTE — Telephone Encounter (Signed)
Patient reviewed test results online.

## 2020-08-08 ENCOUNTER — Ambulatory Visit (HOSPITAL_BASED_OUTPATIENT_CLINIC_OR_DEPARTMENT_OTHER)
Admission: RE | Admit: 2020-08-08 | Discharge: 2020-08-08 | Disposition: A | Discharge status: Home / Self Care | Payer: Medicaid Other | Source: Ambulatory Visit | Attending: Obstetrics | Admitting: Obstetrics

## 2020-08-08 ENCOUNTER — Other Ambulatory Visit: Payer: Self-pay

## 2020-08-08 DIAGNOSIS — N939 Abnormal uterine and vaginal bleeding, unspecified: Secondary | ICD-10-CM | POA: Diagnosis present

## 2020-08-08 DIAGNOSIS — R102 Pelvic and perineal pain: Secondary | ICD-10-CM | POA: Insufficient documentation

## 2020-08-15 ENCOUNTER — Ambulatory Visit: Payer: Medicaid Other | Admitting: Obstetrics
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
## 2021-03-18 ENCOUNTER — Other Ambulatory Visit (HOSPITAL_COMMUNITY)
Admission: RE | Admit: 2021-03-18 | Discharge: 2021-03-18 | Disposition: A | Discharge status: Home / Self Care | Payer: 59 | Source: Ambulatory Visit | Attending: Obstetrics & Gynecology | Admitting: Obstetrics & Gynecology

## 2021-03-18 ENCOUNTER — Encounter: Payer: Self-pay | Admitting: Obstetrics & Gynecology

## 2021-03-18 ENCOUNTER — Ambulatory Visit (INDEPENDENT_AMBULATORY_CARE_PROVIDER_SITE_OTHER): Payer: Medicaid Other | Admitting: Obstetrics & Gynecology

## 2021-03-18 ENCOUNTER — Other Ambulatory Visit: Payer: Self-pay

## 2021-03-18 VITALS — BP 125/86 | HR 74 | Ht 66.0 in | Wt 234.2 lb

## 2021-03-18 DIAGNOSIS — Z01419 Encounter for gynecological examination (general) (routine) without abnormal findings: Secondary | ICD-10-CM | POA: Insufficient documentation

## 2021-03-18 DIAGNOSIS — R102 Pelvic and perineal pain: Secondary | ICD-10-CM

## 2021-03-18 DIAGNOSIS — Z30011 Encounter for initial prescription of contraceptive pills: Secondary | ICD-10-CM

## 2021-03-18 MED ORDER — NORETHIN ACE-ETH ESTRAD-FE 1-20 MG-MCG(24) PO TABS: 1.0000 | ORAL_TABLET | Freq: Every day | ORAL | 11 refills | Status: DC

## 2021-03-18 NOTE — Progress Notes (Signed)
Patient ID: Annette Conway, female   DOB: September 01, 1990, 31 y.o.   MRN: [id number]  Chief Complaint  Patient presents with   Gynecologic Exam    HPI Annette Conway is a 31 y.o. female.  [id number] Patient's last menstrual period was 03/13/2021. Using no BCM but wants to start OCP which she has used previously. C/O dyspareunia with present partner, menstrual pain. HPI  Past Medical History:  Diagnosis Date   Headache(784.0)    UTI (urinary tract infection)    Vaginal Pap smear, abnormal     Past Surgical History:  Procedure Laterality Date   CESAREAN SECTION      Family History  Problem Relation Age of Onset   Diabetes Mother    Diabetes Maternal Grandmother    Diabetes Paternal Grandmother    Anesthesia problems Neg Hx    Hypotension Neg Hx    Malignant hyperthermia Neg Hx    Pseudochol deficiency Neg Hx     Social History Social History   Tobacco Use   Smoking status: Former    Packs/day: 0.25    Years: 4.00    Pack years: 1.00    Types: Cigarettes   Smokeless tobacco: Never  Vaping Use   Vaping Use: Never used  Substance Use Topics   Alcohol use: Not Currently    Comment: occ   Drug use: Yes    Types: Marijuana    No Known Allergies  Current Outpatient Medications  Medication Sig Dispense Refill   doxycycline (VIBRAMYCIN) 100 MG capsule Take 1 capsule (100 mg total) by mouth 2 (two) times daily. (Patient not taking: Reported on 03/18/2021) 14 capsule 0   medroxyPROGESTERone (PROVERA) 10 MG tablet Take 2 tablets (20 mg total) by mouth daily. (Patient not taking: Reported on 07/31/2020) 40 tablet 2   meloxicam (MOBIC) 15 MG tablet 1 po q d x 4 weeks (Patient not taking: Reported on 07/31/2020) 30 tablet 0   methocarbamol (ROBAXIN) 500 MG tablet Take 1 tablet (500 mg total) by mouth every 8 (eight) hours as needed for muscle spasms. (Patient not taking: Reported on 07/31/2020) 30 tablet 0   metroNIDAZOLE (FLAGYL) 500 MG tablet Take 1 tablet (500 mg total) by mouth 2 (two)  times daily. (Patient not taking: Reported on 03/18/2021) 14 tablet 2   nitrofurantoin, macrocrystal-monohydrate, (MACROBID) 100 MG capsule Take 1 capsule (100 mg total) by mouth 2 (two) times daily. (Patient not taking: Reported on 07/17/2019) 10 capsule 0   oxyCODONE-acetaminophen (PERCOCET/ROXICET) 5-325 MG tablet Take 1 tablet by mouth every 4 (four) hours as needed for severe pain. (Patient not taking: Reported on 03/18/2021) 20 tablet 0   phenazopyridine (PYRIDIUM) 200 MG tablet Take 1 tablet (200 mg total) by mouth 3 (three) times daily. (Patient not taking: Reported on 07/17/2019) 6 tablet 0   No current facility-administered medications for this visit.    Review of Systems Review of Systems  Constitutional: Negative.   Respiratory: Negative.    Cardiovascular: Negative.   Gastrointestinal: Negative.   Genitourinary:  Positive for dyspareunia and menstrual problem (pain). Negative for dysuria, urgency, vaginal bleeding, vaginal discharge and vaginal pain.   Blood pressure 125/86, pulse 74, height 5\' 6"  (1.676 m), weight 234 lb 3.2 oz (106.2 kg), last menstrual period 03/13/2021, unknown if currently breastfeeding.  Physical Exam Physical Exam Vitals and nursing note reviewed. Exam conducted with a chaperone present.  Constitutional:      Appearance: Normal appearance.  Cardiovascular:     Rate and Rhythm: Normal rate.  Heart sounds: Normal heart sounds.  Pulmonary:     Effort: Pulmonary effort is normal.     Breath sounds: Normal breath sounds.  Genitourinary:    General: Normal vulva.     Exam position: Lithotomy position.     Vagina: Normal.     Cervix: Normal.     Uterus: Tender.      Adnexa: Right adnexa normal and left adnexa normal.  Neurological:     General: No focal deficit present.     Mental Status: She is alert.  Psychiatric:        Mood and Affect: Mood normal.        Behavior: Behavior normal.    Data Reviewed   Assessment Well woman exam with routine  gynecological exam  Pelvic pain   Plan Meds ordered this encounter  Medications   Norethindrone Acetate-Ethinyl Estrad-FE (LOESTRIN 24 FE) 1-20 MG-MCG(24) tablet    Sig: Take 1 tablet by mouth daily.    Dispense:  28 tablet    Refill:  11   If sx do not improve she should report for further evaluation    Emeterio Reeve 03/18/2021, 9:04 AM

## 2021-03-18 NOTE — Progress Notes (Signed)
Pt in office for an annual exam. She is interested in birth control pills and std testing. She has no other concerns today. LMP:03-13-21

## 2021-03-19 LAB — CYTOLOGY - PAP
Comment: NEGATIVE
Diagnosis: NEGATIVE
High risk HPV: NEGATIVE

## 2021-03-19 LAB — CERVICOVAGINAL ANCILLARY ONLY
Chlamydia: NEGATIVE
Comment: NEGATIVE
Comment: NEGATIVE
Comment: NORMAL
Neisseria Gonorrhea: NEGATIVE
Trichomonas: NEGATIVE

## 2021-08-27 IMAGING — US US PELVIS COMPLETE WITH TRANSVAGINAL
1 series · 13 of 25 positions shown · non-contrast
Comparison: Ultrasound 10/09/2018

CLINICAL DATA: Abnormal uterine bleeding and right pelvic pain for
2 months



[Series 1: us pelvic complete with transvaginal · 13 of 70 slices shown]
[im 1/70]
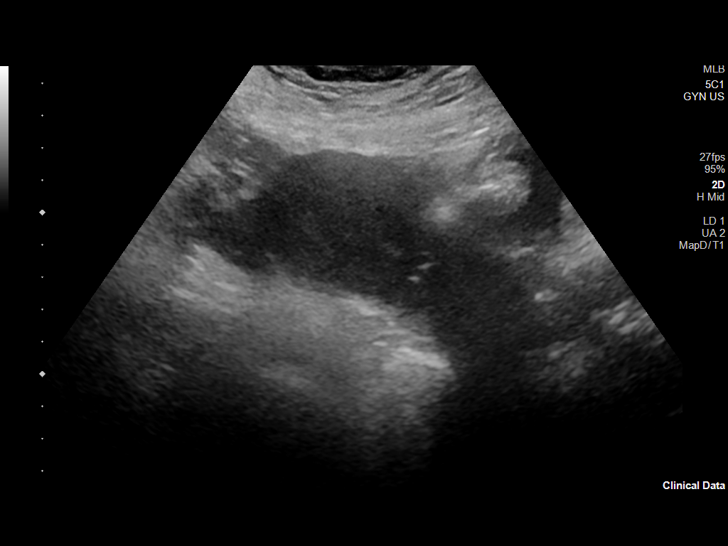
[im 6/70]
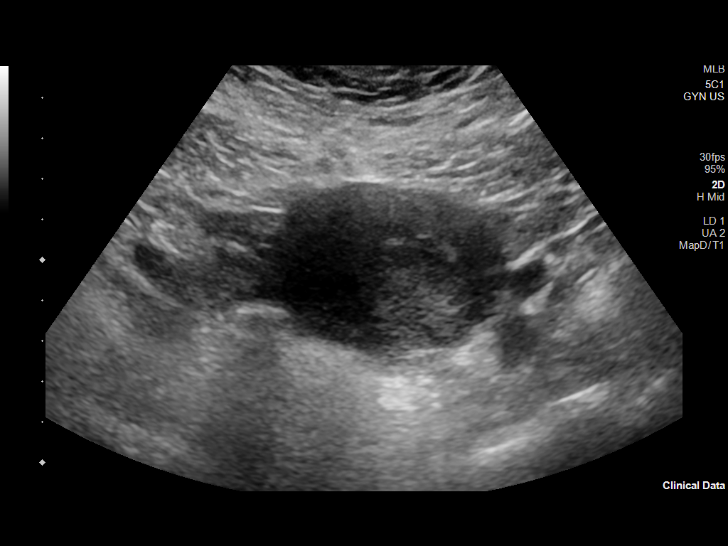
[im 12/70]
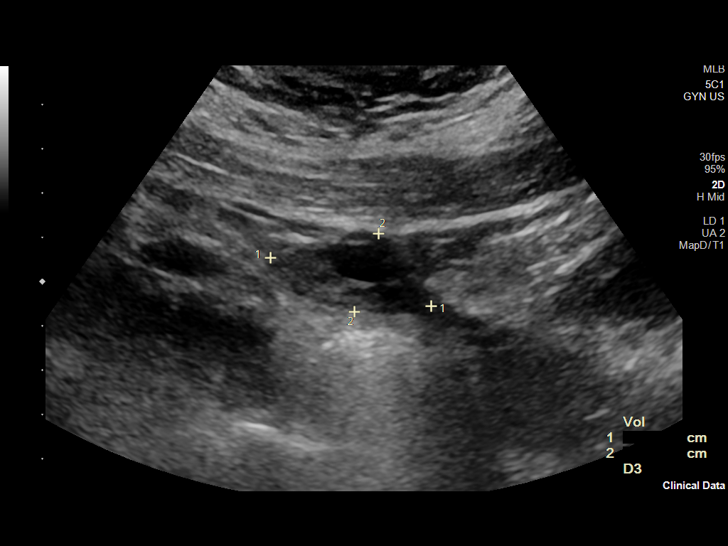
[im 18/70]
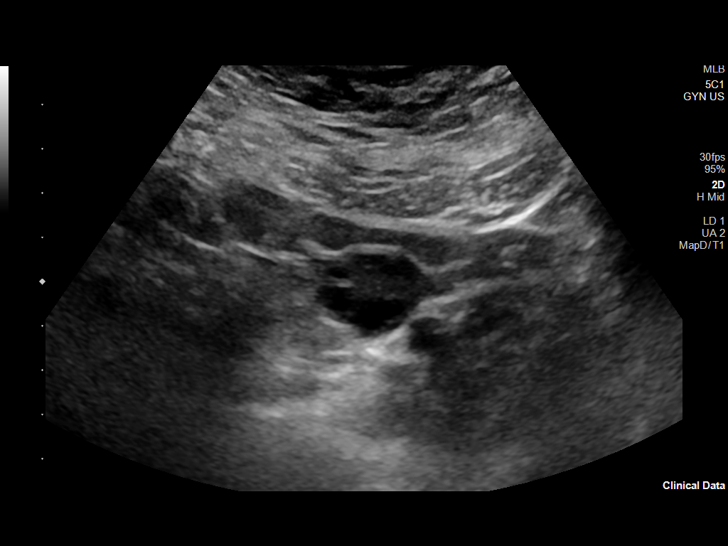
[im 24/70]
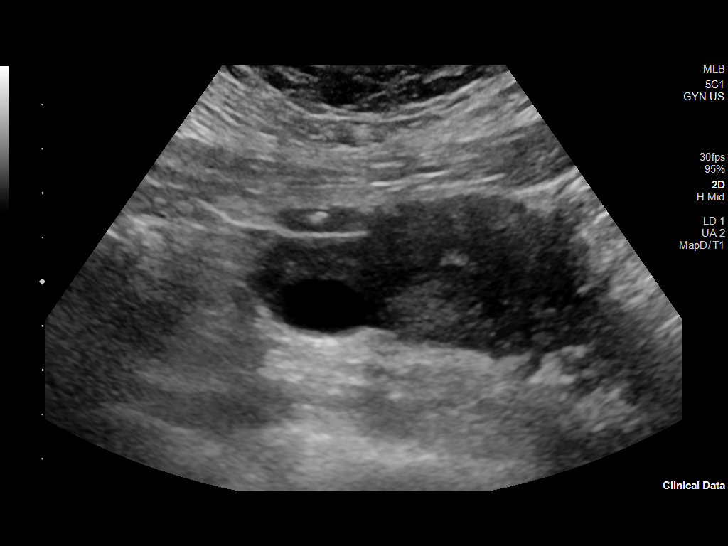
[im 29/70]
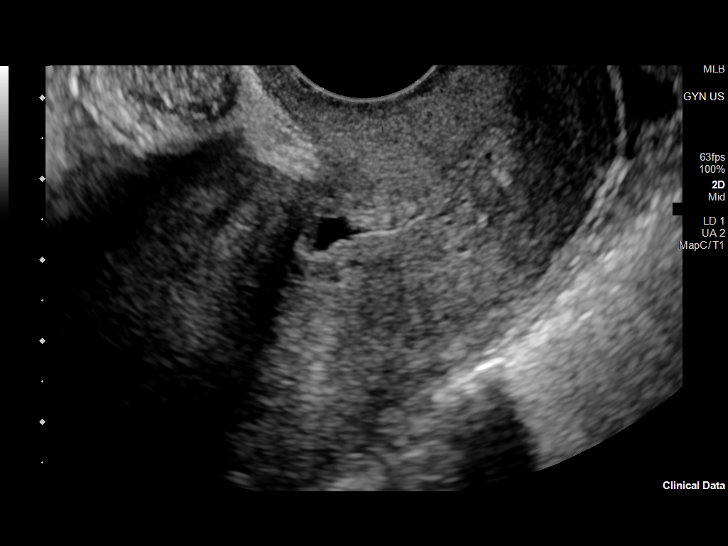
[im 35/70]
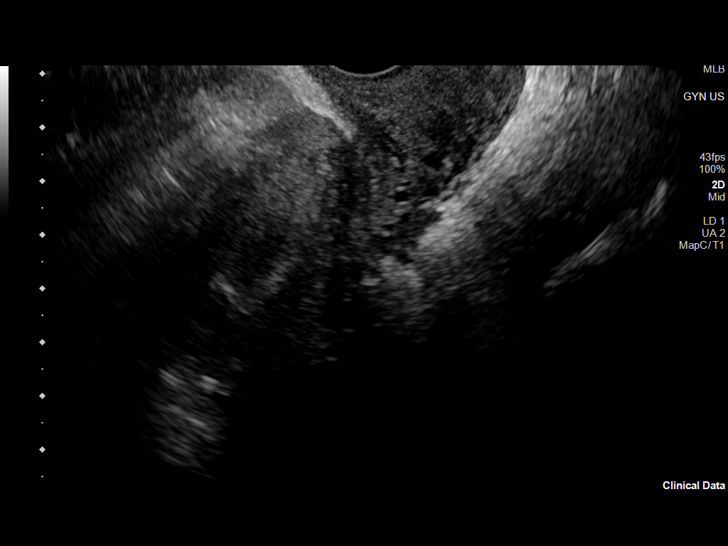
[im 41/70]
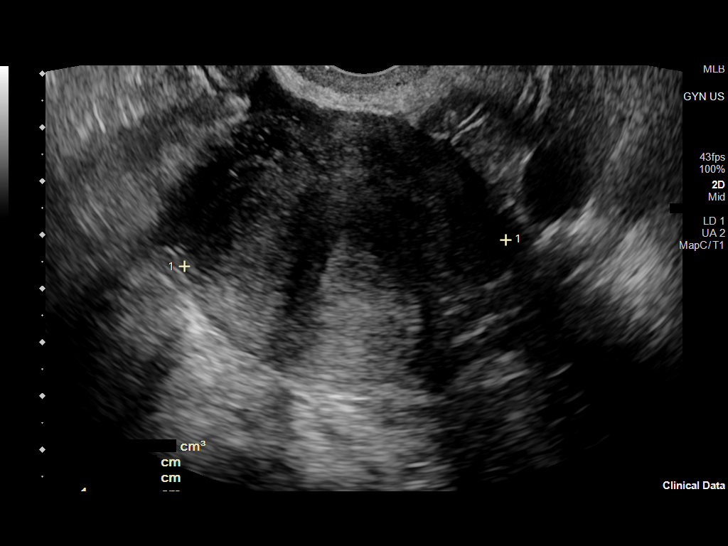
[im 47/70]
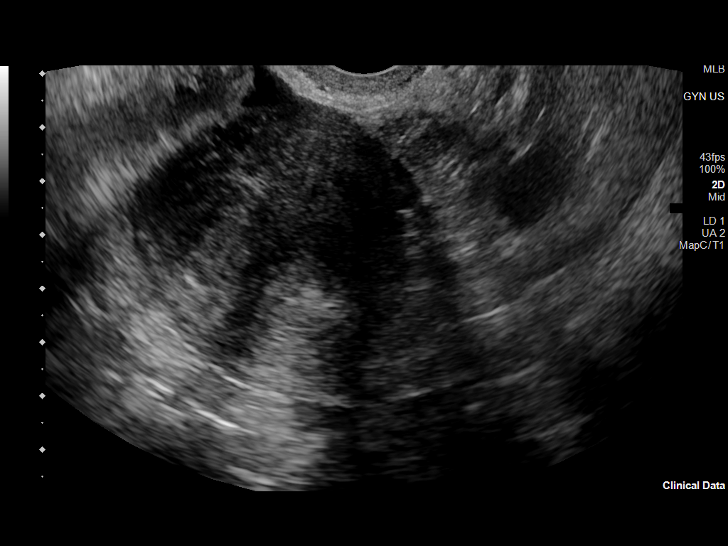
[im 52/70]
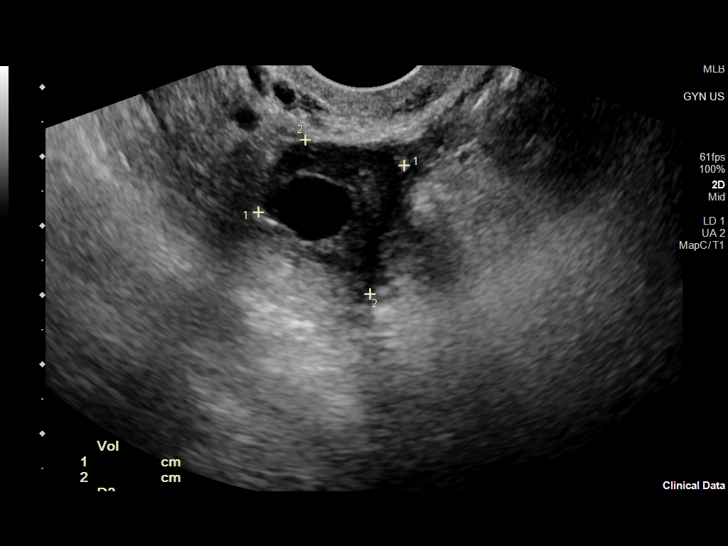
[im 58/70]
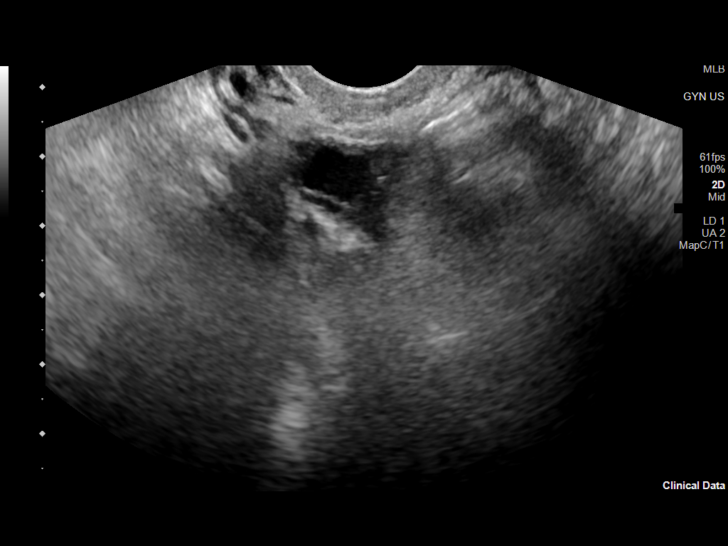
[im 64/70]
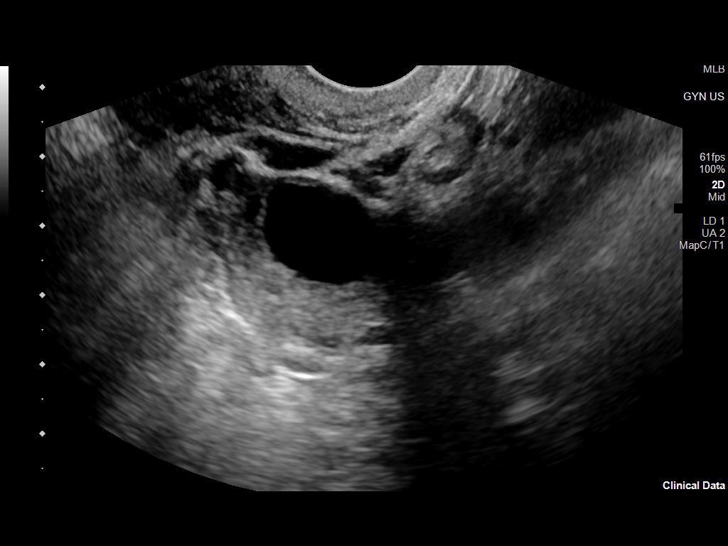
[im 70/70]
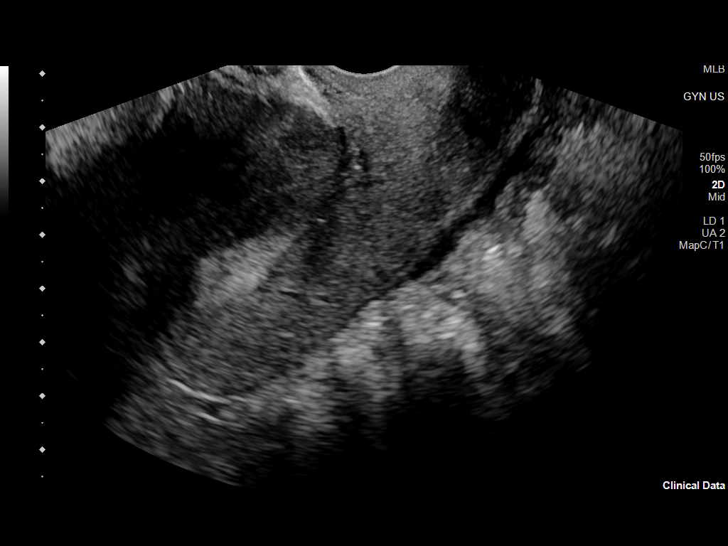

[13 of 25 positions shown; findings below may reference images not displayed]

FINDINGS: Uterus

Measurements: 8.4 x 4.1 x 6 cm = volume: 109 mL. Anteverted uterus.
Heterogeneous, hypoechoic focus along the anterior right uterine
body suggestive of an a uterine leiomyoma measuring 2.5 x 2.3 x
cm. Structure appears to partial abut the endometrial canal. Some
additional intramural hypoattenuation and punctate mineralization
seen towards the anterior lower uterine segment, possibly related to
prior Caesarean section

Endometrium

Thickness: 11.4 mm, non thickened slight distortion of the fundal
endometrium towards the level of the predominantly intramural
presumed fibroid.

Right ovary

Measurements: 2.2 x 2.4 x 2.2 cm = volume: 6.1 mL. Normal anechoic
follicles. No concerning adnexal mass.

Left ovary

Measurements: 2.8 x 2.6 x 2.7 cm = volume: 10.3 mL. Dominant 3 cm
follicle. No concerning adnexal mass.

Other findings

Trace anechoic free fluid in the posterior cul-de-sac
IMPRESSION: 1. Heterogeneous, hypoechoic focus towards the right anterior
uterine fundus suggestive of a uterine leiomyoma, partially abutting
the endometrial. Can result in abnormal uterine bleeding.
2. Endometrial thickness of 11.4 mm is within normal limits as
visualized elsewhere. If bleeding remains unresponsive to hormonal
or medical therapy, sonohysterogram should be considered for focal
lesion work-up. (Ref: Radiological Reasoning: Algorithmic Workup of
Abnormal Vaginal Bleeding with Endovaginal Sonography and
Sonohysterography. AJR [id number]; 191:S68-73)
3. Additional ill-defined hypoechogenicity and few calcifications
towards the anteriorly in segment, favor postsurgical in nature at
the site of prior Pfannenstiel incision for Caesarean section.
4. Dominant 3 cm follicle in the left ovary. No followup imaging
recommended. Note: This recommendation does not apply to patients
with increased risk (genetic, family history, elevated tumor markers
or other high-risk factors) of ovarian cancer. Reference: Radiology
[DATE]):[id number].

## 2021-11-01 ENCOUNTER — Emergency Department (HOSPITAL_BASED_OUTPATIENT_CLINIC_OR_DEPARTMENT_OTHER): Payer: 59

## 2021-11-01 ENCOUNTER — Encounter (HOSPITAL_BASED_OUTPATIENT_CLINIC_OR_DEPARTMENT_OTHER): Payer: Self-pay

## 2021-11-01 ENCOUNTER — Emergency Department (HOSPITAL_BASED_OUTPATIENT_CLINIC_OR_DEPARTMENT_OTHER)
Admission: EM | Admit: 2021-11-01 | Discharge: 2021-11-01 | Discharge status: Against Medical Advice | Payer: 59 | Attending: Emergency Medicine | Admitting: Emergency Medicine

## 2021-11-01 ENCOUNTER — Other Ambulatory Visit: Payer: Self-pay

## 2021-11-01 DIAGNOSIS — R110 Nausea: Secondary | ICD-10-CM | POA: Insufficient documentation

## 2021-11-01 DIAGNOSIS — Z5321 Procedure and treatment not carried out due to patient leaving prior to being seen by health care provider: Secondary | ICD-10-CM | POA: Insufficient documentation

## 2021-11-01 DIAGNOSIS — R109 Unspecified abdominal pain: Secondary | ICD-10-CM | POA: Insufficient documentation

## 2021-11-01 LAB — URINALYSIS, ROUTINE W REFLEX MICROSCOPIC
Bilirubin Urine: NEGATIVE
Glucose, UA: NEGATIVE mg/dL
Leukocytes,Ua: NEGATIVE
Nitrite: NEGATIVE
Protein, ur: 30 mg/dL — AB
Specific Gravity, Urine: 1.036 — ABNORMAL HIGH (ref 1.005–1.030)
pH: 5.5 (ref 5.0–8.0)

## 2021-11-01 LAB — PREGNANCY, URINE: Preg Test, Ur: NEGATIVE

## 2021-11-01 LAB — BASIC METABOLIC PANEL
Anion gap: 9 (ref 5–15)
BUN: 10 mg/dL (ref 6–20)
CO2: 25 mmol/L (ref 22–32)
Calcium: 9.8 mg/dL (ref 8.9–10.3)
Chloride: 106 mmol/L (ref 98–111)
Creatinine, Ser: 0.96 mg/dL (ref 0.44–1.00)
GFR, Estimated: >60 mL/min (ref >60)
Glucose, Bld: 130 mg/dL — ABNORMAL HIGH (ref 70–99)
Potassium: 3.8 mmol/L (ref 3.5–5.1)
Sodium: 140 mmol/L (ref 135–145)

## 2021-11-01 LAB — CBC
HCT: 38 % (ref 36.0–46.0)
Hemoglobin: 12.9 g/dL (ref 12.0–15.0)
MCH: 30.9 pg (ref 26.0–34.0)
MCHC: 33.9 g/dL (ref 30.0–36.0)
MCV: 91.1 fL (ref 80.0–100.0)
Platelets: 301 10*3/uL (ref 150–400)
RBC: 4.17 MIL/uL (ref 3.87–5.11)
RDW: 13.8 % (ref 11.5–15.5)
WBC: 5.7 10*3/uL (ref 4.0–10.5)
nRBC: 0 % (ref 0.0–0.2)

## 2021-11-01 NOTE — ED Notes (Signed)
Notified by registration that patient left ED Lobby. Patient called x3 with no answer.

## 2021-11-01 NOTE — ED Triage Notes (Signed)
Pt c/o right sided flank pain, nausea, and reports urinating less frequent than normal since Tuesday.

## 2022-04-03 ENCOUNTER — Other Ambulatory Visit: Payer: Self-pay

## 2022-04-03 ENCOUNTER — Emergency Department (HOSPITAL_BASED_OUTPATIENT_CLINIC_OR_DEPARTMENT_OTHER)
Admission: EM | Admit: 2022-04-03 | Discharge: 2022-04-03 | Disposition: A | Discharge status: Home / Self Care | Payer: 59 | Attending: Emergency Medicine | Admitting: Emergency Medicine

## 2022-04-03 ENCOUNTER — Encounter (HOSPITAL_BASED_OUTPATIENT_CLINIC_OR_DEPARTMENT_OTHER): Payer: Self-pay

## 2022-04-03 ENCOUNTER — Emergency Department (HOSPITAL_BASED_OUTPATIENT_CLINIC_OR_DEPARTMENT_OTHER): Payer: 59

## 2022-04-03 DIAGNOSIS — R1031 Right lower quadrant pain: Secondary | ICD-10-CM

## 2022-04-03 DIAGNOSIS — D259 Leiomyoma of uterus, unspecified: Secondary | ICD-10-CM | POA: Diagnosis not present

## 2022-04-03 DIAGNOSIS — D219 Benign neoplasm of connective and other soft tissue, unspecified: Secondary | ICD-10-CM

## 2022-04-03 LAB — URINALYSIS, ROUTINE W REFLEX MICROSCOPIC
Bacteria, UA: NONE SEEN
Bilirubin Urine: NEGATIVE
Glucose, UA: NEGATIVE mg/dL
Ketones, ur: NEGATIVE mg/dL
Leukocytes,Ua: NEGATIVE
Nitrite: NEGATIVE
Protein, ur: NEGATIVE mg/dL
Specific Gravity, Urine: 1.011 (ref 1.005–1.030)
pH: 6.5 (ref 5.0–8.0)

## 2022-04-03 LAB — CBC WITH DIFFERENTIAL/PLATELET
Abs Immature Granulocytes: 0.02 10*3/uL (ref 0.00–0.07)
Basophils Absolute: 0 10*3/uL (ref 0.0–0.1)
Basophils Relative: 1 %
Eosinophils Absolute: 1 10*3/uL — ABNORMAL HIGH (ref 0.0–0.5)
Eosinophils Relative: 12 %
HCT: 33.5 % — ABNORMAL LOW (ref 36.0–46.0)
Hemoglobin: 11.6 g/dL — ABNORMAL LOW (ref 12.0–15.0)
Immature Granulocytes: 0 %
Lymphocytes Relative: 29 %
Lymphs Abs: 2.5 10*3/uL (ref 0.7–4.0)
MCH: 31.4 pg (ref 26.0–34.0)
MCHC: 34.6 g/dL (ref 30.0–36.0)
MCV: 90.5 fL (ref 80.0–100.0)
Monocytes Absolute: 0.5 10*3/uL (ref 0.1–1.0)
Monocytes Relative: 6 %
Neutro Abs: 4.4 10*3/uL (ref 1.7–7.7)
Neutrophils Relative %: 52 %
Platelets: 313 10*3/uL (ref 150–400)
RBC: 3.7 MIL/uL — ABNORMAL LOW (ref 3.87–5.11)
RDW: 13.8 % (ref 11.5–15.5)
WBC: 8.4 10*3/uL (ref 4.0–10.5)
nRBC: 0 % (ref 0.0–0.2)

## 2022-04-03 LAB — COMPREHENSIVE METABOLIC PANEL
ALT: 19 U/L (ref 0–44)
AST: 17 U/L (ref 15–41)
Albumin: 4.2 g/dL (ref 3.5–5.0)
Alkaline Phosphatase: 64 U/L (ref 38–126)
Anion gap: 8 (ref 5–15)
BUN: 5 mg/dL — ABNORMAL LOW (ref 6–20)
CO2: 26 mmol/L (ref 22–32)
Calcium: 9.4 mg/dL (ref 8.9–10.3)
Chloride: 104 mmol/L (ref 98–111)
Creatinine, Ser: 0.85 mg/dL (ref 0.44–1.00)
GFR, Estimated: >60 mL/min (ref >60)
Glucose, Bld: 146 mg/dL — ABNORMAL HIGH (ref 70–99)
Potassium: 3.5 mmol/L (ref 3.5–5.1)
Sodium: 138 mmol/L (ref 135–145)
Total Bilirubin: 0.3 mg/dL (ref 0.3–1.2)
Total Protein: 6.9 g/dL (ref 6.5–8.1)

## 2022-04-03 LAB — HCG, SERUM, QUALITATIVE: Preg, Serum: NEGATIVE

## 2022-04-03 LAB — LIPASE, BLOOD: Lipase: 20 U/L (ref 11–51)

## 2022-04-03 MED ORDER — SODIUM CHLORIDE 0.9 % IV BOLUS
1000.0000 mL | Freq: Once | INTRAVENOUS | Status: AC
  Administered 2022-04-03: 1000 mL via INTRAVENOUS

## 2022-04-03 MED ORDER — HYDROMORPHONE HCL 1 MG/ML IJ SOLN
1.0000 mg | Freq: Once | INTRAMUSCULAR | Status: AC
  Administered 2022-04-03: 1 mg via INTRAVENOUS
  Filled 2022-04-03: qty 1

## 2022-04-03 MED ORDER — ONDANSETRON 8 MG PO TBDP: 8.0000 mg | ORAL_TABLET | Freq: Three times a day (TID) | ORAL | 0 refills | Status: DC | PRN

## 2022-04-03 MED ORDER — IOHEXOL 300 MG/ML  SOLN
100.0000 mL | Freq: Once | INTRAMUSCULAR | Status: AC | PRN
  Administered 2022-04-03: 100 mL via INTRAVENOUS

## 2022-04-03 MED ORDER — FENTANYL CITRATE PF 50 MCG/ML IJ SOSY
50.0000 ug | PREFILLED_SYRINGE | Freq: Once | INTRAMUSCULAR | Status: AC
  Administered 2022-04-03: 50 ug via INTRAVENOUS
  Filled 2022-04-03: qty 1

## 2022-04-03 MED ORDER — ONDANSETRON HCL 4 MG/2ML IJ SOLN
4.0000 mg | Freq: Once | INTRAMUSCULAR | Status: AC
  Administered 2022-04-03: 4 mg via INTRAVENOUS
  Filled 2022-04-03: qty 2

## 2022-04-03 MED ORDER — OXYCODONE-ACETAMINOPHEN 5-325 MG PO TABS: 1.0000 | ORAL_TABLET | Freq: Four times a day (QID) | ORAL | 0 refills | Status: DC | PRN

## 2022-04-03 NOTE — Discharge Instructions (Addendum)
You are seen today in the emergency due to abdominal pain.  Your appendix is normal, you do have a 5 cm fibroid which is a margin 2.5 cm last time you had imaging.  Please follow-up with your gynecology for this in the next week or so, call tomorrow or Monday to schedule outpatient follow-up.  Return to the ED if the pain becomes severe, you have vomiting, new or concerning symptoms.  Take the pain medicine as needed for severe pain.

## 2022-04-03 NOTE — ED Provider Notes (Signed)
[place] Provider Note   CSN: [id number] Arrival date & time: 04/03/22  1426     History  Chief Complaint  Patient presents with   Abdominal Pain    Annette Conway is a 32 y.o. female.   Abdominal Pain    32 yo female presents to the ED today for RLQ and right back pain. She states her pain started five days ago and originally started as periumbilical pain. She was seen at [facility] where they recommended she be seen at the ED to rule out appendicitis. She did not go to the ED as she believed her pain was due to her menstrual cycle. She states that since then her pain has progressively worsened and migrated to her RLQ. In addition, this morning she woke up with nausea and vomiting. Her pain is constant and an 8.5/10.  Denies SOB, CP, dysuria, abnormal discharge,  hematuria, abnormal bowel movements, weight loss, or recent illness.   Home Medications Prior to Admission medications   Medication Sig Start Date End Date Taking? Authorizing Provider  ondansetron (ZOFRAN-ODT) 8 MG disintegrating tablet Take 1 tablet (8 mg total) by mouth every 8 (eight) hours as needed for nausea or vomiting. 04/03/22  Yes Sherrill Raring, PA-C  oxyCODONE-acetaminophen (PERCOCET/ROXICET) 5-325 MG tablet Take 1 tablet by mouth every 6 (six) hours as needed for severe pain. 04/03/22  Yes Sherrill Raring, PA-C  doxycycline (VIBRAMYCIN) 100 MG capsule Take 1 capsule (100 mg total) by mouth 2 (two) times daily. Patient not taking: Reported on 03/18/2021 07/31/20   Shelly Bombard, MD  meloxicam [facility]) 15 MG tablet 1 po q d x 4 weeks Patient not taking: Reported on 07/31/2020 08/08/19   Meredith Pel, MD  methocarbamol (ROBAXIN) 500 MG tablet Take 1 tablet (500 mg total) by mouth every 8 (eight) hours as needed for muscle spasms. Patient not taking: Reported on 07/31/2020 08/08/19   Meredith Pel, MD  metroNIDAZOLE (FLAGYL) 500 MG tablet Take 1 tablet (500 mg total) by mouth 2  (two) times daily. Patient not taking: Reported on 03/18/2021 07/31/20   Shelly Bombard, MD  nitrofurantoin, macrocrystal-monohydrate, (MACROBID) 100 MG capsule Take 1 capsule (100 mg total) by mouth 2 (two) times daily. Patient not taking: Reported on 07/17/2019 05/25/19   Loura Halt A, NP  Norethindrone Acetate-Ethinyl Estrad-FE (LOESTRIN 24 FE) 1-20 MG-MCG(24) tablet Take 1 tablet by mouth daily. 03/18/21   Woodroe Mode, MD  phenazopyridine (PYRIDIUM) 200 MG tablet Take 1 tablet (200 mg total) by mouth 3 (three) times daily. Patient not taking: Reported on 07/17/2019 05/25/19   Orvan July, NP      Allergies    Patient has no known allergies.    Review of Systems   Review of Systems  Gastrointestinal:  Positive for abdominal pain.    Physical Exam Updated Vital Signs BP 139/80 (BP Location: Right Arm)   Pulse 84   Temp 98.5 F (36.9 C) (Oral)   Resp 17   Ht '5\' 6"'$  (1.676 m)   Wt 96.6 kg   SpO2 98%   BMI 34.38 kg/m  Physical Exam Vitals and nursing note reviewed. Exam conducted with a chaperone present.  Constitutional:      Appearance: Normal appearance.  HENT:     Head: Normocephalic and atraumatic.  Eyes:     General: No scleral icterus.       Right eye: No discharge.        Left eye:  No discharge.     Extraocular Movements: Extraocular movements intact.     Pupils: Pupils are equal, round, and reactive to light.  Cardiovascular:     Rate and Rhythm: Normal rate and regular rhythm.     Pulses: Normal pulses.     Heart sounds: Normal heart sounds. No murmur heard.    No friction rub. No gallop.  Pulmonary:     Effort: Pulmonary effort is normal. No respiratory distress.     Breath sounds: Normal breath sounds.  Abdominal:     General: Abdomen is flat. Bowel sounds are normal. There is no distension.     Palpations: Abdomen is soft.     Tenderness: There is abdominal tenderness in the right lower quadrant.  Skin:    General: Skin is warm and dry.     Coloration:  Skin is not jaundiced.  Neurological:     Mental Status: She is alert. Mental status is at baseline.     Coordination: Coordination normal.     ED Results / Procedures / Treatments   Labs (all labs ordered are listed, but only abnormal results are displayed) Labs Reviewed  CBC WITH DIFFERENTIAL/PLATELET - Abnormal; Notable for the following components:      Result Value   RBC 3.70 (*)    Hemoglobin 11.6 (*)    HCT 33.5 (*)    Eosinophils Absolute 1.0 (*)    All other components within normal limits  COMPREHENSIVE METABOLIC PANEL - Abnormal; Notable for the following components:   Glucose, Bld 146 (*)    BUN 5 (*)    All other components within normal limits  URINALYSIS, ROUTINE W REFLEX MICROSCOPIC - Abnormal; Notable for the following components:   Hgb urine dipstick MODERATE (*)    All other components within normal limits  LIPASE, BLOOD  HCG, SERUM, QUALITATIVE    EKG None  Radiology CT Abdomen Pelvis W Contrast  Result Date: 04/03/2022 CLINICAL DATA:  RLQ abdominal pain EXAM: CT ABDOMEN AND PELVIS WITH CONTRAST TECHNIQUE: Multidetector CT imaging of the abdomen and pelvis was performed using the standard protocol following bolus administration of intravenous contrast. RADIATION DOSE REDUCTION: This exam was performed according to the departmental dose-optimization program which includes automated exposure control, adjustment of the mA and/or kV according to patient size and/or use of iterative reconstruction technique. CONTRAST:  122m OMNIPAQUE IOHEXOL 300 MG/ML  SOLN COMPARISON:  CT 10/09/2018, ultrasound 08/08/2020 FINDINGS: Lower chest: Chest Included lung bases are clear. Heart size is normal. Hepatobiliary: No focal liver abnormality is seen. No gallstones, gallbladder wall thickening, or biliary dilatation. Pancreas: Unremarkable. No pancreatic ductal dilatation or surrounding inflammatory changes. Spleen: Normal in size without focal abnormality. Adrenals/Urinary Tract:  Unremarkable adrenal glands. Kidneys enhance symmetrically without focal lesion, stone, or hydronephrosis. Ureters are nondilated. Urinary bladder appears unremarkable for the degree of distention. Stomach/Bowel: Stomach is within normal limits. Appendix appears normal (series 2, image 53). Colonic diverticulosis. No evidence of bowel wall thickening, distention, or inflammatory changes. Vascular/Lymphatic: No significant vascular findings are present. No enlarged abdominal or pelvic lymph nodes. Reproductive: Anteverted uterus. 5.0 cm rounded low-density lesion within the uterine fundus, likely fibroid. This has increased in size since 2022 (measured 2.5 cm on prior ultrasound). Adnexal regions within normal limits. Other: No free fluid. No abdominopelvic fluid collection. No pneumoperitoneum. Fat containing umbilical hernia. Musculoskeletal: No acute or significant osseous findings. IMPRESSION: 1. No acute abdominopelvic findings. Normal appendix. 2. Colonic diverticulosis without evidence of acute diverticulitis. 3. Enlarging fundal fibroid  measuring 5 cm (previously 2.5 cm). 4. Fat containing umbilical hernia. Electronically Signed   By: Davina Poke D.O.   On: 04/03/2022 16:51    Procedures Procedures    Medications Ordered in ED Medications  ondansetron (ZOFRAN) injection 4 mg (4 mg Intravenous Given 04/03/22 1509)  fentaNYL (SUBLIMAZE) injection 50 mcg (50 mcg Intravenous Given 04/03/22 1511)  sodium chloride 0.9 % bolus 1,000 mL (0 mLs Intravenous Stopped 04/03/22 1734)  HYDROmorphone (DILAUDID) injection 1 mg (1 mg Intravenous Given 04/03/22 1543)  iohexol (OMNIPAQUE) 300 MG/ML solution 100 mL (100 mLs Intravenous Contrast Given 04/03/22 1637)    ED Course/ Medical Decision Making/ A&P                             Medical Decision Making Amount and/or Complexity of Data Reviewed Labs: ordered. Radiology: ordered.  Risk Prescription drug management.   Patient presents due to right  lower quadrant abdominal pain.  Differential includes appendicitis, torsion, UTI, pyelonephritis, colitis, ovarian cyst, TOA.  On exam patient has focal right lower quadrant tenderness without rebound or guarding.  No CVA tenderness, no peritoneal signs and afebrile.  Nontoxic-appearing.  I ordered fluids, narcotics, Zofran for pain and nausea.  Patient has hematuria on UA consistent with being on her menstrual cycle.  CBC is without leukocytosis, mild anemia with hemoglobin 11.6 not grossly changed from baseline.  Patient is not pregnant, not ectopic.  Lipase within normal limits, consistent with pancreatitis.  CMP without gross electro derangement, AKI or transaminitis.  CT abdomen is negative for appendicitis but patient does have a 5 cm fibroid, suspect this could be contributing to the worsening pain especially given it started during her menstrual cycle.  Will have her follow-up with her gynecologist, I considered ultrasound but I do not think this is a torsion I do not think additional imaging is needed on emergent patient.  I discussed strict return precautions with patient who verbalized understanding and agreement with the plan.  Will have her follow-up with her PCP and OBGYN        Final Clinical Impression(s) / ED Diagnoses Final diagnoses:  RLQ abdominal pain  Fibroid    Rx / DC Orders ED Discharge Orders          Ordered    oxyCODONE-acetaminophen (PERCOCET/ROXICET) 5-325 MG tablet  Every 6 hours PRN        04/03/22 1719    ondansetron (ZOFRAN-ODT) 8 MG disintegrating tablet  Every 8 hours PRN        04/03/22 1719              Sherrill Raring, PA-C 04/03/22 1823    Tegeler, Gwenyth Allegra, MD 04/04/22 0000

## 2022-04-03 NOTE — ED Notes (Signed)
Pt stated that he pain was still present but that she flt ok at this time and would take pain medication from the Pharm at home. Rn offered to speak top PA, pt stated she thatt she would be fine. Pt stated that someone was coming to pick her up.. pt encouraged to return if necessary

## 2022-04-03 NOTE — ED Triage Notes (Signed)
Patient here POV from Home.  Endorses RLQ ABD Pain that began 3-4 Days ago. Some nausea recently and emesis today. No Hematuria or Dysuria.   No Fevers at Home.   NAD Noted during Triage. A&Ox4. GCS 15. Ambulatory.

## 2022-04-28 ENCOUNTER — Encounter: Payer: Self-pay | Admitting: Obstetrics and Gynecology

## 2022-04-28 ENCOUNTER — Ambulatory Visit (INDEPENDENT_AMBULATORY_CARE_PROVIDER_SITE_OTHER): Payer: Medicaid Other | Admitting: Obstetrics and Gynecology

## 2022-04-28 VITALS — BP 126/87 | HR 87 | Ht 66.0 in | Wt 231.0 lb

## 2022-04-28 DIAGNOSIS — R102 Pelvic and perineal pain: Secondary | ICD-10-CM | POA: Diagnosis not present

## 2022-04-28 DIAGNOSIS — D259 Leiomyoma of uterus, unspecified: Secondary | ICD-10-CM | POA: Diagnosis not present

## 2022-04-28 MED ORDER — NAPROXEN 500 MG PO TABS: 500.0000 mg | ORAL_TABLET | Freq: Two times a day (BID) | ORAL | 2 refills | Status: DC

## 2022-04-28 NOTE — Progress Notes (Signed)
Pt was seen in ED for pelvic pain.  Pt had scan showing Fibroid that has increased in size.   Pt has irregular, heavy painful cycles lasting 7 days. This has been for the last 2 months.  Pt denies any urinary symptoms.

## 2022-04-28 NOTE — Progress Notes (Signed)
32 yo P2 here for the evaluation of fibroid uterus. Patient was recently seen in the ED and told that her fibroids have increase in size. She reports persistent RLQ pain and a monthly period lasting 7 days (previously 3-4 days) associated with severe dysmenorrhea. Patient states that changes in her cycle has been present in the past 2 months. She desires to conceive again. Patient has had 2 previous cesarean section. Patient reports ibuprofen and tylenol has not helped with the pain.  Past Medical History:  Diagnosis Date   Headache(784.0)    UTI (urinary tract infection)    Vaginal Pap smear, abnormal    Past Surgical History:  Procedure Laterality Date   CESAREAN SECTION     Family History  Problem Relation Age of Onset   Diabetes Mother    Diabetes Maternal Grandmother    Diabetes Paternal Grandmother    Anesthesia problems Neg Hx    Hypotension Neg Hx    Malignant hyperthermia Neg Hx    Pseudochol deficiency Neg Hx    Social History   Tobacco Use   Smoking status: Former    Types: Cigars   Smokeless tobacco: Never  Vaping Use   Vaping Use: Never used  Substance Use Topics   Alcohol use: Yes    Comment: occ   Drug use: Yes    Types: Marijuana    Comment: Occ   ROS  See pertinent in HPI. All other systems reviewed and non contributory Blood pressure 126/87, pulse 87, height 5\' 6"  (1.676 m), weight 231 lb (104.8 kg), last menstrual period 04/21/2022, unknown if currently breastfeeding. GENERAL: Well-developed, well-nourished female in no acute distress.  ABDOMEN: Soft, nontender, nondistended. No organomegaly. PELVIC: Normal external female genitalia. Vagina is pink and rugated.  Normal discharge. Normal appearing cervix. Uterus is normal in size. No adnexal mass or tenderness. Chaperone present during the pelvic exam EXTREMITIES: No cyanosis, clubbing, or edema, 2+ distal pulses.  CT Abdomen Pelvis W Contrast  Result Date: 04/03/2022 CLINICAL DATA:  RLQ abdominal pain  EXAM: CT ABDOMEN AND PELVIS WITH CONTRAST TECHNIQUE: Multidetector CT imaging of the abdomen and pelvis was performed using the standard protocol following bolus administration of intravenous contrast. RADIATION DOSE REDUCTION: This exam was performed according to the departmental dose-optimization program which includes automated exposure control, adjustment of the mA and/or kV according to patient size and/or use of iterative reconstruction technique. CONTRAST:  158mL OMNIPAQUE IOHEXOL 300 MG/ML  SOLN COMPARISON:  CT 10/09/2018, ultrasound 08/08/2020 FINDINGS: Lower chest: Chest Included lung bases are clear. Heart size is normal. Hepatobiliary: No focal liver abnormality is seen. No gallstones, gallbladder wall thickening, or biliary dilatation. Pancreas: Unremarkable. No pancreatic ductal dilatation or surrounding inflammatory changes. Spleen: Normal in size without focal abnormality. Adrenals/Urinary Tract: Unremarkable adrenal glands. Kidneys enhance symmetrically without focal lesion, stone, or hydronephrosis. Ureters are nondilated. Urinary bladder appears unremarkable for the degree of distention. Stomach/Bowel: Stomach is within normal limits. Appendix appears normal (series 2, image 53). Colonic diverticulosis. No evidence of bowel wall thickening, distention, or inflammatory changes. Vascular/Lymphatic: No significant vascular findings are present. No enlarged abdominal or pelvic lymph nodes. Reproductive: Anteverted uterus. 5.0 cm rounded low-density lesion within the uterine fundus, likely fibroid. This has increased in size since 2022 (measured 2.5 cm on prior ultrasound). Adnexal regions within normal limits. Other: No free fluid. No abdominopelvic fluid collection. No pneumoperitoneum. Fat containing umbilical hernia. Musculoskeletal: No acute or significant osseous findings. IMPRESSION: 1. No acute abdominopelvic findings. Normal appendix. 2. Colonic diverticulosis  without evidence of acute  diverticulitis. 3. Enlarging fundal fibroid measuring 5 cm (previously 2.5 cm). 4. Fat containing umbilical hernia. Electronically Signed   By: Davina Poke D.O.   On: 04/03/2022 16:51     A/P 32 yo with fibroid uterus  - Pelvic ultrasound ordered for better measurement and localization of fibroid - Rx naproxen provided - Patient is interested in definite surgical management with myomectomy. Discussed mode of surgery will depend on location of fibroid. Reviewed risks and benefits of laparotomy including but not limited to risks of bleeding, infection and damage to adjacent organs. Patient verbalized understanding and all questions were answered - Patient is starting a new employment and may wish to delay surgery until full benefits are active

## 2022-04-30 ENCOUNTER — Ambulatory Visit (HOSPITAL_BASED_OUTPATIENT_CLINIC_OR_DEPARTMENT_OTHER)
Admission: RE | Admit: 2022-04-30 | Discharge: 2022-04-30 | Disposition: A | Discharge status: Home / Self Care | Payer: Medicaid Other | Source: Ambulatory Visit | Attending: Obstetrics and Gynecology | Admitting: Obstetrics and Gynecology

## 2022-04-30 DIAGNOSIS — D259 Leiomyoma of uterus, unspecified: Secondary | ICD-10-CM | POA: Diagnosis present

## 2022-04-30 DIAGNOSIS — R102 Pelvic and perineal pain: Secondary | ICD-10-CM | POA: Insufficient documentation

## 2022-06-11 ENCOUNTER — Encounter: Payer: Self-pay | Admitting: Obstetrics and Gynecology

## 2022-06-11 ENCOUNTER — Ambulatory Visit (INDEPENDENT_AMBULATORY_CARE_PROVIDER_SITE_OTHER): Payer: Medicaid Other | Admitting: Obstetrics and Gynecology

## 2022-06-11 VITALS — BP 131/86 | HR 88 | Wt 224.3 lb

## 2022-06-11 DIAGNOSIS — D259 Leiomyoma of uterus, unspecified: Secondary | ICD-10-CM | POA: Insufficient documentation

## 2022-06-11 DIAGNOSIS — D251 Intramural leiomyoma of uterus: Secondary | ICD-10-CM | POA: Diagnosis not present

## 2022-06-11 DIAGNOSIS — N946 Dysmenorrhea, unspecified: Secondary | ICD-10-CM | POA: Diagnosis not present

## 2022-06-11 DIAGNOSIS — D250 Submucous leiomyoma of uterus: Secondary | ICD-10-CM

## 2022-06-11 MED ORDER — OXYCODONE-ACETAMINOPHEN 5-325 MG PO TABS: 1.0000 | ORAL_TABLET | Freq: Four times a day (QID) | ORAL | 0 refills | Status: DC | PRN

## 2022-06-11 NOTE — Progress Notes (Signed)
  CC: uterine fibroid Subjective:    Patient ID: Annette Conway, female    DOB: 1990/03/25, 32 y.o.   MRN: [id number]  HPI 32 yo G3P2, c/s x 2, Pt seen for follow up of uterine fibroid and dysmenorrhea.  She c/o intermittent RLQ pain and heavier menses.  Menses last 7 days and she does pass clots.  Fibroid diagnosed on CT and ultrasound.  Discussed treatment options with patient.   Review of Systems     Objective:   Physical Exam Vitals:   06/11/22 1113  BP: 131/86  Pulse: 88   CLINICAL DATA:  Fibroid uterus, LMP 04/21/2022, G2P2   EXAM: TRANSABDOMINAL AND TRANSVAGINAL ULTRASOUND OF PELVIS   TECHNIQUE: Both transabdominal and transvaginal ultrasound examinations of the pelvis were performed. Transabdominal technique was performed for global imaging of the pelvis including uterus, ovaries, adnexal regions, and pelvic cul-de-sac. It was necessary to proceed with endovaginal exam following the transabdominal exam to visualize the uterus and endometrium.   COMPARISON:  08/08/2020   FINDINGS: Uterus   Measurements: 9.7 x 6.0 x 7.3 cm = volume: 223 mL. Anteverted. Large submucosal leiomyoma upper uterus posteriorly 4.6 x 4.2 x 4.5 cm previously 2.5 x 2.2 x 2.3 cm. Mass effect upon adjacent endometrium. No additional uterine masses.   Endometrium   Thickness: 3 mm.  No endometrial fluid or mass   Right ovary   Measurements: 2.7 x 1.6 x 1.9 cm = volume: 4.1 mL. Normal morphology without mass   Left ovary   Measurements: 2.8 x 2.1 x 1.9 cm = volume: 5.6 mL. Normal morphology without mass   Other findings   No free pelvic fluid or adnexal masses.   IMPRESSION: Interval increase in size of a submucosal leiomyoma at the posterior upper uterus now 4.6 cm greatest diameter, exerting mass effect upon the endometrial complex.   Remainder of exam unremarkable.      Assessment & Plan:   1. Intramural and submucous leiomyoma of uterus Discussed treatment options  including Myomectomy Sonata Myfembree (GnRH agonists) UFE Hysterectomy.  Discussed mid tier treatment such as Sonata, UFE or myfembree is appropriate.  Pt does not desire large surgery at this time.  Reviewed Sonata procedure in detail with review of ultrasound and Sonata procedure animation.  Pt made aware there will be some decrease in size and improvement of bleeding, but there is no guarantee of resolution of discomfort. Pt made aware she will need to fill out insurance form, she has new insurance at this time.  Will tentatively schedule Sonata and myosure resection of submucosal fibroid  - oxyCODONE-acetaminophen (PERCOCET/ROXICET) 5-325 MG tablet; Take 1 tablet by mouth every 6 (six) hours as needed for severe pain.  Dispense: 30 tablet; Refill: 0  2. Dysmenorrhea Short course of narcotic medication for pain control due to menses occurring shortly.  - oxyCODONE-acetaminophen (PERCOCET/ROXICET) 5-325 MG tablet; Take 1 tablet by mouth every 6 (six) hours as needed for severe pain.  Dispense: 30 tablet; Refill: 0  I spent 30 minutes dedicated to the care of this patient including previsit review of records, face to face time with the patient discussing treatment options and post visit testing.   Warden Fillers, MD Faculty Attending, [facility]

## 2022-06-11 NOTE — Progress Notes (Signed)
Pt is in the office to follow up and discuss Sonata procedure.

## 2023-04-03 ENCOUNTER — Telehealth: Payer: Self-pay | Admitting: Family Medicine

## 2023-04-03 MED ORDER — OSELTAMIVIR PHOSPHATE 75 MG PO CAPS: 75.0000 mg | ORAL_CAPSULE | Freq: Two times a day (BID) | ORAL | 0 refills | Status: DC

## 2023-04-03 NOTE — Telephone Encounter (Signed)
 Tamiflu sent in for flu test positive.

## 2023-04-07 ENCOUNTER — Ambulatory Visit
Admission: EM | Admit: 2023-04-07 | Discharge: 2023-04-07 | Disposition: A | Discharge status: Home / Self Care | Payer: Medicaid Other | Attending: Family Medicine | Admitting: Family Medicine

## 2023-04-07 ENCOUNTER — Encounter: Payer: Self-pay | Admitting: Emergency Medicine

## 2023-04-07 DIAGNOSIS — B349 Viral infection, unspecified: Secondary | ICD-10-CM

## 2023-04-07 MED ORDER — ONDANSETRON HCL 4 MG PO TABS: 4.0000 mg | ORAL_TABLET | Freq: Three times a day (TID) | ORAL | 0 refills | Status: DC | PRN

## 2023-04-07 MED ORDER — KETOROLAC TROMETHAMINE 30 MG/ML IJ SOLN
30.0000 mg | Freq: Once | INTRAMUSCULAR | Status: AC
  Administered 2023-04-07: 30 mg via INTRAMUSCULAR

## 2023-04-07 MED ORDER — ONDANSETRON HCL 4 MG/2ML IJ SOLN
4.0000 mg | Freq: Once | INTRAMUSCULAR | Status: AC
  Administered 2023-04-07: 4 mg via INTRAMUSCULAR

## 2023-04-07 NOTE — ED Triage Notes (Signed)
 Patient c/o severe headache and started vomiting this evening.  Patient recently was diagnosed w/influenza last week.  Patient has taken Tylenol and Ibuprofen w/o relief.

## 2023-04-07 NOTE — ED Provider Notes (Signed)
 [facility]    CSN: [id number] Arrival date & time: 04/07/23  1841      History   Chief Complaint Chief Complaint  Patient presents with   Emesis    HPI Annette Conway is a 33 y.o. female.  Patient with a recent diagnosis of influenza A presents today with vomiting and a headache.  Reports that she thought her symptoms have resolved however she continued to experience some fatigue and this afternoon episode of persistent vomitus.  She continues to experience nausea and complains of a headache.  Currently afebrile.  Past Medical History:  Diagnosis Date   Headache(784.0)    UTI (urinary tract infection)    Vaginal Pap smear, abnormal     Patient Active Problem List   Diagnosis Date Noted   Fibroid uterus 06/11/2022   Dysmenorrhea 06/11/2022   History of C-section 06/23/2013    Past Surgical History:  Procedure Laterality Date   CESAREAN SECTION      OB History     Gravida  3   Para  2   Term  2   Preterm      AB  1   Living  2      SAB      IAB      Ectopic  1   Multiple      Live Births  2            Home Medications    Prior to Admission medications   Medication Sig Start Date End Date Taking? Authorizing Provider  ondansetron (ZOFRAN) 4 MG tablet Take 1 tablet (4 mg total) by mouth every 8 (eight) hours as needed for nausea or vomiting. 04/07/23  Yes Bing Neighbors, NP  doxycycline (VIBRAMYCIN) 100 MG capsule Take 1 capsule (100 mg total) by mouth 2 (two) times daily. Patient not taking: Reported on 03/18/2021 07/31/20   Brock Bad, MD  meloxicam [facility]) 15 MG tablet 1 po q d x 4 weeks Patient not taking: Reported on 07/31/2020 08/08/19   Cammy Copa, MD  methocarbamol (ROBAXIN) 500 MG tablet Take 1 tablet (500 mg total) by mouth every 8 (eight) hours as needed for muscle spasms. Patient not taking: Reported on 07/31/2020 08/08/19   Cammy Copa, MD  naproxen (NAPROSYN) 500 MG tablet Take 1 tablet (500 mg  total) by mouth 2 (two) times daily with a meal. As needed for pain Patient not taking: Reported on 06/11/2022 04/28/22   Constant, Peggy, MD  Norethindrone Acetate-Ethinyl Estrad-FE (LOESTRIN 24 FE) 1-20 MG-MCG(24) tablet Take 1 tablet by mouth daily. Patient not taking: Reported on 04/28/2022 03/18/21   Adam Phenix, MD  ondansetron (ZOFRAN-ODT) 8 MG disintegrating tablet Take 1 tablet (8 mg total) by mouth every 8 (eight) hours as needed for nausea or vomiting. Patient not taking: Reported on 04/28/2022 04/03/22   Theron Arista, PA-C  oseltamivir (TAMIFLU) 75 MG capsule Take 1 capsule (75 mg total) by mouth every 12 (twelve) hours. 04/03/23   Zenia Resides, MD  oxyCODONE-acetaminophen (PERCOCET/ROXICET) 5-325 MG tablet Take 1 tablet by mouth every 6 (six) hours as needed for severe pain. 06/11/22   Warden Fillers, MD    Family History Family History  Problem Relation Age of Onset   Diabetes Mother    Diabetes Maternal Grandmother    Diabetes Paternal Grandmother    Anesthesia problems Neg Hx    Hypotension Neg Hx    Malignant hyperthermia Neg Hx    Pseudochol  deficiency Neg Hx     Social History Social History   Tobacco Use   Smoking status: Former    Types: Cigars   Smokeless tobacco: Never  Vaping Use   Vaping status: Never Used  Substance Use Topics   Alcohol use: Yes    Comment: occ   Drug use: Yes    Types: Marijuana    Comment: Occ     Allergies   Patient has no known allergies.   Review of Systems Review of Systems  Gastrointestinal:  Positive for vomiting.     Physical Exam Triage Vital Signs ED Triage Vitals  Encounter Vitals Group     BP 04/07/23 1904 (!) 154/92     Systolic BP Percentile --      Diastolic BP Percentile --      Pulse Rate 04/07/23 1904 83     Resp --      Temp 04/07/23 1904 98.7 F (37.1 C)     Temp Source 04/07/23 1904 Oral     SpO2 04/07/23 1904 97 %     Weight --      Height --      Head Circumference --      Peak Flow --       Pain Score 04/07/23 1905 9     Pain Loc --      Pain Education --      Exclude from Growth Chart --    No data found.  Updated Vital Signs BP (!) 154/92 (BP Location: Right Arm)   Pulse 83   Temp 98.7 F (37.1 C) (Oral)   SpO2 97%   Visual Acuity Right Eye Distance:   Left Eye Distance:   Bilateral Distance:    Right Eye Near:   Left Eye Near:    Bilateral Near:     Physical Exam Constitutional:      Appearance: Normal appearance. She is ill-appearing.  HENT:     Head: Normocephalic and atraumatic.  Cardiovascular:     Rate and Rhythm: Normal rate and regular rhythm.  Pulmonary:     Effort: Pulmonary effort is normal.     Breath sounds: Normal breath sounds.  Abdominal:     Palpations: Abdomen is soft.  Skin:    General: Skin is warm and dry.  Neurological:     General: No focal deficit present.     Mental Status: She is alert and oriented to person, place, and time.      UC Treatments / Results  Labs (all labs ordered are listed, but only abnormal results are displayed) Labs Reviewed - No data to display  EKG   Radiology No results found.  Procedures Procedures (including critical care time)  Medications Ordered in UC Medications  ondansetron (ZOFRAN) injection 4 mg (4 mg Intramuscular Given 04/07/23 1912)  ketorolac (TORADOL) 30 MG/ML injection 30 mg (30 mg Intramuscular Given 04/07/23 1912)    Initial Impression / Assessment and Plan / UC Course  I have reviewed the triage vital signs and the nursing notes.  Pertinent labs & imaging results that were available during my care of the patient were reviewed by me and considered in my medical decision making (see chart for details).    Patient continues to have residual symptoms from recent diagnosis of influenza A.  Reassuring she is afebrile.  He is to have some residual GI symptoms.  Treating nausea with 4 mg of IM Zofran and prescribed oral Zofran as needed for home management of  nausea.   Given Toradol 30 mg IM for acute generalized headache.  Encouraged to hydrate with fluids.  Return precautions given if symptoms worsen or do not improve. Final Clinical Impressions(s) / UC Diagnoses   Final diagnoses:  Viral illness   Discharge Instructions   None    ED Prescriptions     Medication Sig Dispense Auth. Provider   ondansetron (ZOFRAN) 4 MG tablet Take 1 tablet (4 mg total) by mouth every 8 (eight) hours as needed for nausea or vomiting. 20 tablet Bing Neighbors, NP      PDMP not reviewed this encounter.   Bing Neighbors, NP 04/08/23 1714

## 2023-04-18 ENCOUNTER — Emergency Department (HOSPITAL_BASED_OUTPATIENT_CLINIC_OR_DEPARTMENT_OTHER)

## 2023-04-18 ENCOUNTER — Other Ambulatory Visit: Payer: Self-pay

## 2023-04-18 ENCOUNTER — Encounter (HOSPITAL_BASED_OUTPATIENT_CLINIC_OR_DEPARTMENT_OTHER): Payer: Self-pay | Admitting: Urology

## 2023-04-18 ENCOUNTER — Emergency Department (HOSPITAL_BASED_OUTPATIENT_CLINIC_OR_DEPARTMENT_OTHER)
Admission: EM | Admit: 2023-04-18 | Discharge: 2023-04-18 | Disposition: A | Discharge status: Home / Self Care | Attending: Emergency Medicine | Admitting: Emergency Medicine

## 2023-04-18 DIAGNOSIS — S060X9A Concussion with loss of consciousness of unspecified duration, initial encounter: Secondary | ICD-10-CM | POA: Diagnosis not present

## 2023-04-18 DIAGNOSIS — Z79899 Other long term (current) drug therapy: Secondary | ICD-10-CM | POA: Insufficient documentation

## 2023-04-18 DIAGNOSIS — S0990XA Unspecified injury of head, initial encounter: Secondary | ICD-10-CM | POA: Diagnosis present

## 2023-04-18 DIAGNOSIS — Y9301 Activity, walking, marching and hiking: Secondary | ICD-10-CM | POA: Insufficient documentation

## 2023-04-18 DIAGNOSIS — S060X1A Concussion with loss of consciousness of 30 minutes or less, initial encounter: Secondary | ICD-10-CM | POA: Diagnosis not present

## 2023-04-18 DIAGNOSIS — Y990 Civilian activity done for income or pay: Secondary | ICD-10-CM | POA: Diagnosis not present

## 2023-04-18 DIAGNOSIS — W010XXA Fall on same level from slipping, tripping and stumbling without subsequent striking against object, initial encounter: Secondary | ICD-10-CM | POA: Diagnosis not present

## 2023-04-18 MED ORDER — ONDANSETRON HCL 4 MG PO TABS: 4.0000 mg | ORAL_TABLET | Freq: Four times a day (QID) | ORAL | 0 refills | Status: AC

## 2023-04-18 MED ORDER — ACETAMINOPHEN 500 MG PO TABS
1000.0000 mg | ORAL_TABLET | Freq: Once | ORAL | Status: AC
  Administered 2023-04-18: 1000 mg via ORAL
  Filled 2023-04-18: qty 2

## 2023-04-18 NOTE — Discharge Instructions (Addendum)
 You have been seen and discharged from the emergency department.  Your head CT imaging was normal.  I believe you are suffering from symptoms of concussion.  Stay well-hydrated.  Take Tylenol ibuprofen as needed for headache control.  Use nausea medicine as needed.  Get a lot of rest.  Follow-up with your primary provider for further evaluation and further care. Take home medications as prescribed. If you have any worsening symptoms or further concerns for your health please return to an emergency department for further evaluation.

## 2023-04-18 NOTE — ED Provider Notes (Signed)
 [facility] Provider Note   CSN: [id number] Arrival date & time: 04/18/23  1531     History  Chief Complaint  Patient presents with   Head Injury    Annette Conway is a 33 y.o. female.  HPI   33 year old otherwise healthy female presents to the emergency department after head injury.  Last evening late when the patient got up from the couch to walk to her bedroom her son reported that she hit the side of the door female the right side of her head and lost consciousness.  The next thing she remembered was waking up at the end of the hallway.  She took medication and went to sleep.  She had restless sleep but felt decent waking up this morning.  However while at work she developed a right-sided headache, felt woozy and lightheaded.  She was seen in urgent care and sent here for CT of the head.  She denies any neck pain.  She has got mild photophobia and nausea.  There is been no vomiting.  No focal neuro complaint.  Home Medications Prior to Admission medications   Medication Sig Start Date End Date Taking? Authorizing Provider  doxycycline (VIBRAMYCIN) 100 MG capsule Take 1 capsule (100 mg total) by mouth 2 (two) times daily. Patient not taking: Reported on 03/18/2021 07/31/20   Brock Bad, MD  meloxicam [facility]) 15 MG tablet 1 po q d x 4 weeks Patient not taking: Reported on 07/31/2020 08/08/19   Cammy Copa, MD  methocarbamol (ROBAXIN) 500 MG tablet Take 1 tablet (500 mg total) by mouth every 8 (eight) hours as needed for muscle spasms. Patient not taking: Reported on 07/31/2020 08/08/19   Cammy Copa, MD  naproxen (NAPROSYN) 500 MG tablet Take 1 tablet (500 mg total) by mouth 2 (two) times daily with a meal. As needed for pain Patient not taking: Reported on 06/11/2022 04/28/22   Constant, Peggy, MD  Norethindrone Acetate-Ethinyl Estrad-FE (LOESTRIN 24 FE) 1-20 MG-MCG(24) tablet Take 1 tablet by mouth daily. Patient not taking:  Reported on 04/28/2022 03/18/21   Adam Phenix, MD  ondansetron [facility]) 4 MG tablet Take 1 tablet (4 mg total) by mouth every 8 (eight) hours as needed for nausea or vomiting. 04/07/23   Bing Neighbors, NP  ondansetron (ZOFRAN-ODT) 8 MG disintegrating tablet Take 1 tablet (8 mg total) by mouth every 8 (eight) hours as needed for nausea or vomiting. Patient not taking: Reported on 04/28/2022 04/03/22   Theron Arista, PA-C  oseltamivir (TAMIFLU) 75 MG capsule Take 1 capsule (75 mg total) by mouth every 12 (twelve) hours. 04/03/23   Zenia Resides, MD  oxyCODONE-acetaminophen (PERCOCET/ROXICET) 5-325 MG tablet Take 1 tablet by mouth every 6 (six) hours as needed for severe pain. 06/11/22   Warden Fillers, MD      Allergies    Patient has no known allergies.    Review of Systems   Review of Systems  Constitutional:  Positive for fatigue. Negative for fever.  Eyes:  Positive for photophobia.  Respiratory:  Negative for shortness of breath.   Cardiovascular:  Negative for chest pain.  Gastrointestinal:  Negative for abdominal pain, diarrhea and vomiting.  Musculoskeletal:  Negative for gait problem.  Skin:  Negative for rash.  Neurological:  Positive for light-headedness and headaches. Negative for tremors, seizures, facial asymmetry, speech difficulty, weakness and numbness.  Psychiatric/Behavioral:  Positive for confusion.     Physical Exam Updated Vital  Signs BP 133/89 (BP Location: Left Arm)   Pulse 75   Temp 97.9 F (36.6 C)   Resp 18   Ht 5\' 6"  (1.676 m)   Wt 101.7 kg   SpO2 100%   BMI 36.19 kg/m  Physical Exam Vitals and nursing note reviewed.  Constitutional:      General: She is not in acute distress.    Appearance: Normal appearance.  HENT:     Head: Normocephalic.     Mouth/Throat:     Mouth: Mucous membranes are moist.  Eyes:     Extraocular Movements: Extraocular movements intact.     Pupils: Pupils are equal, round, and reactive to light.  Cardiovascular:      Rate and Rhythm: Normal rate.  Pulmonary:     Effort: Pulmonary effort is normal. No respiratory distress.  Abdominal:     Palpations: Abdomen is soft.     Tenderness: There is no abdominal tenderness.  Musculoskeletal:     Cervical back: Neck supple. No rigidity.  Skin:    General: Skin is warm.  Neurological:     General: No focal deficit present.     Mental Status: She is alert and oriented to person, place, and time. Mental status is at baseline.  Psychiatric:        Mood and Affect: Mood normal.     ED Results / Procedures / Treatments   Labs (all labs ordered are listed, but only abnormal results are displayed) Labs Reviewed - No data to display  EKG None  Radiology CT Head Wo Contrast Result Date: 04/18/2023 CLINICAL DATA:  Head trauma. EXAM: CT HEAD WITHOUT CONTRAST TECHNIQUE: Contiguous axial images were obtained from the base of the skull through the vertex without intravenous contrast. RADIATION DOSE REDUCTION: This exam was performed according to the departmental dose-optimization program which includes automated exposure control, adjustment of the mA and/or kV according to patient size and/or use of iterative reconstruction technique. COMPARISON:  None Available. FINDINGS: Brain: No evidence of acute infarction, hemorrhage, hydrocephalus, extra-axial collection or mass lesion/mass effect. Vascular: No hyperdense vessel or unexpected calcification. Skull: Normal. Negative for fracture or focal lesion. Sinuses/Orbits: No acute finding. Other: None. IMPRESSION: No acute intracranial abnormality. Electronically Signed   By: Darliss Cheney M.D.   On: 04/18/2023 17:03    Procedures Procedures    Medications Ordered in ED Medications  acetaminophen (TYLENOL) tablet 1,000 mg (has no administration in time range)    ED Course/ Medical Decision Making/ A&P                                 Medical Decision Making Amount and/or Complexity of Data Reviewed Radiology:  ordered.  Risk OTC drugs. Prescription drug management.   33 year old female presents emergency department with what appears to be head injury and loss of consciousness.  Son states that the patient walked into a door frame and fell to the ground.  Presents today with ongoing headache and feeling lightheaded and weak at work with photophobia and nausea.  Vitals are normal and stable.  Patient is neuro intact and otherwise well-appearing.  CT of the head shows no acute finding.  I believe that the patient is suffering from concussion-like symptoms.  Discussed this with the patient and educated on symptomatic treatment.  Patient otherwise feels well and is requesting be discharged home.  Patient at this time appears safe and stable for discharge and close outpatient  follow up. Discharge plan and strict return to ED precautions discussed, patient verbalizes understanding and agreement.        Final Clinical Impression(s) / ED Diagnoses Final diagnoses:  None    Rx / DC Orders ED Discharge Orders     None         Rozelle Logan, DO 04/18/23 1826

## 2023-04-18 NOTE — ED Triage Notes (Signed)
 Pt states fall last night and hit right forehead, states LOC with fall. States fell while walking down hallway and fell into door frame  Unknown if lOC caused fall  States started to get woozy while at work today  Sent from work at [facility] for CT head

## 2023-04-28 DIAGNOSIS — K0889 Other specified disorders of teeth and supporting structures: Secondary | ICD-10-CM | POA: Diagnosis not present

## 2023-04-28 DIAGNOSIS — Z131 Encounter for screening for diabetes mellitus: Secondary | ICD-10-CM | POA: Diagnosis not present

## 2023-04-28 DIAGNOSIS — E66811 Obesity, class 1: Secondary | ICD-10-CM | POA: Diagnosis not present

## 2023-04-28 DIAGNOSIS — Z6834 Body mass index (BMI) 34.0-34.9, adult: Secondary | ICD-10-CM | POA: Diagnosis not present

## 2023-04-28 DIAGNOSIS — Z1159 Encounter for screening for other viral diseases: Secondary | ICD-10-CM | POA: Diagnosis not present

## 2023-04-28 DIAGNOSIS — E559 Vitamin D deficiency, unspecified: Secondary | ICD-10-CM | POA: Diagnosis not present

## 2023-04-28 DIAGNOSIS — Z862 Personal history of diseases of the blood and blood-forming organs and certain disorders involving the immune mechanism: Secondary | ICD-10-CM | POA: Diagnosis not present

## 2023-04-28 DIAGNOSIS — E6609 Other obesity due to excess calories: Secondary | ICD-10-CM | POA: Diagnosis not present

## 2023-04-28 DIAGNOSIS — Z0000 Encounter for general adult medical examination without abnormal findings: Secondary | ICD-10-CM | POA: Diagnosis not present

## 2023-04-28 DIAGNOSIS — Z13220 Encounter for screening for lipoid disorders: Secondary | ICD-10-CM | POA: Diagnosis not present

## 2023-04-28 DIAGNOSIS — E65 Localized adiposity: Secondary | ICD-10-CM | POA: Diagnosis not present

## 2023-04-28 DIAGNOSIS — Z114 Encounter for screening for human immunodeficiency virus [HIV]: Secondary | ICD-10-CM | POA: Diagnosis not present

## 2023-05-26 DIAGNOSIS — Z79899 Other long term (current) drug therapy: Secondary | ICD-10-CM | POA: Diagnosis not present

## 2023-05-31 DIAGNOSIS — Z79899 Other long term (current) drug therapy: Secondary | ICD-10-CM | POA: Diagnosis not present

## 2023-06-14 DIAGNOSIS — Z79899 Other long term (current) drug therapy: Secondary | ICD-10-CM | POA: Diagnosis not present

## 2023-07-16 DIAGNOSIS — Z79899 Other long term (current) drug therapy: Secondary | ICD-10-CM | POA: Diagnosis not present

## 2023-08-10 DIAGNOSIS — Z79899 Other long term (current) drug therapy: Secondary | ICD-10-CM | POA: Diagnosis not present

## 2023-08-16 DIAGNOSIS — Z79899 Other long term (current) drug therapy: Secondary | ICD-10-CM | POA: Diagnosis not present

## 2023-08-27 ENCOUNTER — Encounter: Payer: Self-pay | Admitting: Advanced Practice Midwife

## 2023-09-12 DIAGNOSIS — Z79899 Other long term (current) drug therapy: Secondary | ICD-10-CM | POA: Diagnosis not present

## 2023-09-15 DIAGNOSIS — Z79899 Other long term (current) drug therapy: Secondary | ICD-10-CM | POA: Diagnosis not present

## 2023-10-08 ENCOUNTER — Emergency Department (HOSPITAL_BASED_OUTPATIENT_CLINIC_OR_DEPARTMENT_OTHER)

## 2023-10-08 ENCOUNTER — Emergency Department (HOSPITAL_BASED_OUTPATIENT_CLINIC_OR_DEPARTMENT_OTHER)
Admission: EM | Admit: 2023-10-08 | Discharge: 2023-10-08 | Disposition: A | Discharge status: Home / Self Care | Attending: Emergency Medicine | Admitting: Emergency Medicine

## 2023-10-08 ENCOUNTER — Encounter (INDEPENDENT_AMBULATORY_CARE_PROVIDER_SITE_OTHER): Payer: Self-pay

## 2023-10-08 ENCOUNTER — Encounter (HOSPITAL_BASED_OUTPATIENT_CLINIC_OR_DEPARTMENT_OTHER): Payer: Self-pay

## 2023-10-08 DIAGNOSIS — D219 Benign neoplasm of connective and other soft tissue, unspecified: Secondary | ICD-10-CM | POA: Insufficient documentation

## 2023-10-08 DIAGNOSIS — N939 Abnormal uterine and vaginal bleeding, unspecified: Secondary | ICD-10-CM | POA: Diagnosis not present

## 2023-10-08 DIAGNOSIS — Z87891 Personal history of nicotine dependence: Secondary | ICD-10-CM | POA: Insufficient documentation

## 2023-10-08 DIAGNOSIS — D259 Leiomyoma of uterus, unspecified: Secondary | ICD-10-CM | POA: Diagnosis not present

## 2023-10-08 DIAGNOSIS — R102 Pelvic and perineal pain: Secondary | ICD-10-CM | POA: Diagnosis not present

## 2023-10-08 LAB — COMPREHENSIVE METABOLIC PANEL WITH GFR
ALT: 19 U/L (ref 0–44)
AST: 16 U/L (ref 15–41)
Albumin: 4.2 g/dL (ref 3.5–5.0)
Alkaline Phosphatase: 68 U/L (ref 38–126)
Anion gap: 11 (ref 5–15)
BUN: 7 mg/dL (ref 6–20)
CO2: 24 mmol/L (ref 22–32)
Calcium: 9.4 mg/dL (ref 8.9–10.3)
Chloride: 105 mmol/L (ref 98–111)
Creatinine, Ser: 0.83 mg/dL (ref 0.44–1.00)
GFR, Estimated: >60 mL/min (ref >60)
Glucose, Bld: 85 mg/dL (ref 70–99)
Potassium: 4 mmol/L (ref 3.5–5.1)
Sodium: 141 mmol/L (ref 135–145)
Total Bilirubin: 0.3 mg/dL (ref 0.0–1.2)
Total Protein: 6.7 g/dL (ref 6.5–8.1)

## 2023-10-08 LAB — CBC
HCT: 37.1 % (ref 36.0–46.0)
Hemoglobin: 12.8 g/dL (ref 12.0–15.0)
MCH: 32.3 pg (ref 26.0–34.0)
MCHC: 34.5 g/dL (ref 30.0–36.0)
MCV: 93.7 fL (ref 80.0–100.0)
Platelets: 291 K/uL (ref 150–400)
RBC: 3.96 MIL/uL (ref 3.87–5.11)
RDW: 13.8 % (ref 11.5–15.5)
WBC: 5.2 K/uL (ref 4.0–10.5)
nRBC: 0 % (ref 0.0–0.2)

## 2023-10-08 LAB — URINALYSIS, ROUTINE W REFLEX MICROSCOPIC
Bilirubin Urine: NEGATIVE
Glucose, UA: NEGATIVE mg/dL
Hgb urine dipstick: NEGATIVE
Ketones, ur: NEGATIVE mg/dL
Leukocytes,Ua: NEGATIVE
Nitrite: NEGATIVE
Protein, ur: NEGATIVE mg/dL
Specific Gravity, Urine: 1.007 (ref 1.005–1.030)
pH: 6 (ref 5.0–8.0)

## 2023-10-08 LAB — LIPASE, BLOOD: Lipase: 51 U/L (ref 11–51)

## 2023-10-08 LAB — PREGNANCY, URINE: Preg Test, Ur: NEGATIVE

## 2023-10-08 MED ORDER — KETOROLAC TROMETHAMINE 15 MG/ML IJ SOLN
15.0000 mg | Freq: Once | INTRAMUSCULAR | Status: AC
  Administered 2023-10-08: 15 mg via INTRAVENOUS
  Filled 2023-10-08: qty 1

## 2023-10-08 NOTE — ED Triage Notes (Signed)
 Pt c/o severe abd pain LRQ, last night after sex, passed huge blood clots. States bleeding is temporary, pain takes longer to go away.  Issue x2wks, intermittent.  Also c/o same after last 3 times having sex  Advises hx fibroids, ectopic pregnancy, pain feels similar.

## 2023-10-08 NOTE — ED Provider Notes (Signed)
 [place] EMERGENCY DEPARTMENT AT [facility] Provider Note   CSN: [id number] Arrival date & time: 10/08/23  1246     Patient presents with: Abdominal Pain and Vaginal Bleeding   Annette Conway is a 33 y.o. female.    Abdominal Pain Associated symptoms: vaginal bleeding   Vaginal Bleeding Associated symptoms: abdominal pain     33 year old female presents emergency department complaints of lower abdominal pain, vaginal bleeding.  States that she has been having episodes for the past couple of weeks after having sexual intercourse with her fianc.  States this happened 3 times.  Reports that after intercourse, develops lower abdominal pain as well as heavy menstrual bleeding.  States that she has noticed large clots.  States that this last for an hour or 2 before the bleeding improves but still deals with some persistent mild pelvic pain.  States that she had some pain this morning prompting visit to the emergency department.  States that she does have history of fibroids/ectopic pregnancy and states that pain feels similar but not nearly as bad as when she had an ectopic pregnancy.  Denies any fevers, chills, nausea vomiting, urinary symptoms, change in bowel habits.  Past medical history significant for UTI, headache,  Prior to Admission medications   Medication Sig Start Date End Date Taking? Authorizing Provider  doxycycline  (VIBRAMYCIN ) 100 MG capsule Take 1 capsule (100 mg total) by mouth 2 (two) times daily. Patient not taking: Reported on 03/18/2021 07/31/20   Rudy Carlin LABOR, MD  meloxicam  (MOBIC ) 15 MG tablet 1 po q d x 4 weeks Patient not taking: Reported on 07/31/2020 08/08/19   Addie Cordella Hamilton, MD  methocarbamol  (ROBAXIN ) 500 MG tablet Take 1 tablet (500 mg total) by mouth every 8 (eight) hours as needed for muscle spasms. Patient not taking: Reported on 07/31/2020 08/08/19   Addie Cordella Hamilton, MD  naproxen  (NAPROSYN ) 500 MG tablet Take 1 tablet (500 mg total) by  mouth 2 (two) times daily with a meal. As needed for pain Patient not taking: Reported on 06/11/2022 04/28/22   Constant, Peggy, MD  Norethindrone Acetate-Ethinyl Estrad-FE (LOESTRIN 24 FE) 1-20 MG-MCG(24) tablet Take 1 tablet by mouth daily. Patient not taking: Reported on 04/28/2022 03/18/21   Eveline Lynwood MATSU, MD  ondansetron  (ZOFRAN ) 4 MG tablet Take 1 tablet (4 mg total) by mouth every 6 (six) hours. 04/18/23   Horton, Roxie HERO, DO  ondansetron  (ZOFRAN -ODT) 8 MG disintegrating tablet Take 1 tablet (8 mg total) by mouth every 8 (eight) hours as needed for nausea or vomiting. Patient not taking: Reported on 04/28/2022 04/03/22   Emelia Sluder, PA-C  oseltamivir  (TAMIFLU ) 75 MG capsule Take 1 capsule (75 mg total) by mouth every 12 (twelve) hours. 04/03/23   Vonna Sharlet POUR, MD  oxyCODONE -acetaminophen  (PERCOCET/ROXICET) 5-325 MG tablet Take 1 tablet by mouth every 6 (six) hours as needed for severe pain. 06/11/22   Zina Jerilynn LABOR, MD    Allergies: Patient has no known allergies.    Review of Systems  Gastrointestinal:  Positive for abdominal pain.  Genitourinary:  Positive for vaginal bleeding.  All other systems reviewed and are negative.   Updated Vital Signs BP 117/75 (BP Location: Right Arm)   Pulse 83   Temp 98.3 F (36.8 C) (Oral)   Resp 15   SpO2 100%   Physical Exam Vitals and nursing note reviewed.  Constitutional:      General: She is not in acute distress.    Appearance: She is well-developed.  HENT:     Head: Normocephalic and atraumatic.  Eyes:     Conjunctiva/sclera: Conjunctivae normal.  Cardiovascular:     Rate and Rhythm: Normal rate and regular rhythm.     Heart sounds: No murmur heard. Pulmonary:     Effort: Pulmonary effort is normal. No respiratory distress.     Breath sounds: Normal breath sounds.  Abdominal:     Palpations: Abdomen is soft.     Tenderness: There is abdominal tenderness in the suprapubic area. There is no right CVA tenderness or left CVA  tenderness.     Comments: Suprapubic tenderness.  Just right of midline suprapubic region reproducible tenderness as well.  Musculoskeletal:        General: No swelling.     Cervical back: Neck supple.  Skin:    General: Skin is warm and dry.     Capillary Refill: Capillary refill takes less than 2 seconds.  Neurological:     Mental Status: She is alert.  Psychiatric:        Mood and Affect: Mood normal.     (all labs ordered are listed, but only abnormal results are displayed) Labs Reviewed  CBC  URINALYSIS, ROUTINE W REFLEX MICROSCOPIC  PREGNANCY, URINE  COMPREHENSIVE METABOLIC PANEL WITH GFR  LIPASE, BLOOD    EKG: None  Radiology: US  Pelvis Complete Result Date: 10/08/2023 CLINICAL DATA:  Pelvic pain EXAM: TRANSABDOMINAL AND TRANSVAGINAL ULTRASOUND OF PELVIS DOPPLER ULTRASOUND OF OVARIES TECHNIQUE: Both transabdominal and transvaginal ultrasound examinations of the pelvis were performed. Transabdominal technique was performed for global imaging of the pelvis including uterus, ovaries, adnexal regions, and pelvic cul-de-sac. It was necessary to proceed with endovaginal exam following the transabdominal exam to visualize the uterus endometrium adnexa. Color and duplex Doppler ultrasound was utilized to evaluate blood flow to the ovaries. COMPARISON:  Ultrasound 04/30/2022, CT 04/03/2022 FINDINGS: Uterus Measurements: 9.4 x 5.4 x 7.2 cm = volume: 191.6 mL. Multiple fibroids. Probable rim calcified fibroid at the fundus measuring 4.5 x 3.7 x 3.8 cm, previously 4.6 x 4.2 x 4.5 cm. Additional hypoechoic fundal fibroid measuring 1.6 x 1.5 x 1.4 cm. Left anterior subserosal uterine corpus fibroid measuring 12 x 9 x 12 mm. Endometrium Thickness: 6.6 mm.  No focal abnormality visualized. Right ovary Measurements: 3.1 x 2.3 x 2 cm = volume: 7.4 mL. Normal appearance/no adnexal mass. Left ovary Measurements: 3.4 x 2.1 x 1.8 cm = volume: 6.7 mL. Normal appearance/no adnexal mass. Pulsed Doppler  evaluation of both ovaries demonstrates normal low-resistance arterial and venous waveforms. Other findings No abnormal free fluid. IMPRESSION: 1. Negative for ovarian torsion or suspicious ovarian mass. 2. Multiple uterine fibroids. Electronically Signed   By: Luke Bun M.D.   On: 10/08/2023 15:20   US  Transvaginal Non-OB Result Date: 10/08/2023 CLINICAL DATA:  Pelvic pain EXAM: TRANSABDOMINAL AND TRANSVAGINAL ULTRASOUND OF PELVIS DOPPLER ULTRASOUND OF OVARIES TECHNIQUE: Both transabdominal and transvaginal ultrasound examinations of the pelvis were performed. Transabdominal technique was performed for global imaging of the pelvis including uterus, ovaries, adnexal regions, and pelvic cul-de-sac. It was necessary to proceed with endovaginal exam following the transabdominal exam to visualize the uterus endometrium adnexa. Color and duplex Doppler ultrasound was utilized to evaluate blood flow to the ovaries. COMPARISON:  Ultrasound 04/30/2022, CT 04/03/2022 FINDINGS: Uterus Measurements: 9.4 x 5.4 x 7.2 cm = volume: 191.6 mL. Multiple fibroids. Probable rim calcified fibroid at the fundus measuring 4.5 x 3.7 x 3.8 cm, previously 4.6 x 4.2 x 4.5 cm. Additional hypoechoic fundal  fibroid measuring 1.6 x 1.5 x 1.4 cm. Left anterior subserosal uterine corpus fibroid measuring 12 x 9 x 12 mm. Endometrium Thickness: 6.6 mm.  No focal abnormality visualized. Right ovary Measurements: 3.1 x 2.3 x 2 cm = volume: 7.4 mL. Normal appearance/no adnexal mass. Left ovary Measurements: 3.4 x 2.1 x 1.8 cm = volume: 6.7 mL. Normal appearance/no adnexal mass. Pulsed Doppler evaluation of both ovaries demonstrates normal low-resistance arterial and venous waveforms. Other findings No abnormal free fluid. IMPRESSION: 1. Negative for ovarian torsion or suspicious ovarian mass. 2. Multiple uterine fibroids. Electronically Signed   By: Luke Bun M.D.   On: 10/08/2023 15:20   US  Art/Ven Flow Abd Pelv Doppler Result Date:  10/08/2023 CLINICAL DATA:  Pelvic pain EXAM: TRANSABDOMINAL AND TRANSVAGINAL ULTRASOUND OF PELVIS DOPPLER ULTRASOUND OF OVARIES TECHNIQUE: Both transabdominal and transvaginal ultrasound examinations of the pelvis were performed. Transabdominal technique was performed for global imaging of the pelvis including uterus, ovaries, adnexal regions, and pelvic cul-de-sac. It was necessary to proceed with endovaginal exam following the transabdominal exam to visualize the uterus endometrium adnexa. Color and duplex Doppler ultrasound was utilized to evaluate blood flow to the ovaries. COMPARISON:  Ultrasound 04/30/2022, CT 04/03/2022 FINDINGS: Uterus Measurements: 9.4 x 5.4 x 7.2 cm = volume: 191.6 mL. Multiple fibroids. Probable rim calcified fibroid at the fundus measuring 4.5 x 3.7 x 3.8 cm, previously 4.6 x 4.2 x 4.5 cm. Additional hypoechoic fundal fibroid measuring 1.6 x 1.5 x 1.4 cm. Left anterior subserosal uterine corpus fibroid measuring 12 x 9 x 12 mm. Endometrium Thickness: 6.6 mm.  No focal abnormality visualized. Right ovary Measurements: 3.1 x 2.3 x 2 cm = volume: 7.4 mL. Normal appearance/no adnexal mass. Left ovary Measurements: 3.4 x 2.1 x 1.8 cm = volume: 6.7 mL. Normal appearance/no adnexal mass. Pulsed Doppler evaluation of both ovaries demonstrates normal low-resistance arterial and venous waveforms. Other findings No abnormal free fluid. IMPRESSION: 1. Negative for ovarian torsion or suspicious ovarian mass. 2. Multiple uterine fibroids. Electronically Signed   By: Luke Bun M.D.   On: 10/08/2023 15:20     Procedures   Medications Ordered in the ED  ketorolac  (TORADOL ) 15 MG/ML injection 15 mg (15 mg Intravenous Given 10/08/23 1521)                                    Medical Decision Making Amount and/or Complexity of Data Reviewed Labs: ordered. Radiology: ordered.  Risk Prescription drug management.   This patient presents to the ED for concern of pelvic pain, this involves  an extensive number of treatment options, and is a complaint that carries with it a high risk of complications and morbidity.  The differential diagnosis includes ectopic pregnancy, ovarian torsion, tubo-ovarian abscess, fibroid, diverticulitis, appendicitis, cystitis, pyelonephritis, nephrolithiasis, other   Co morbidities that complicate the patient evaluation  See HPI   Additional history obtained:  Additional history obtained from EMR External records from outside source obtained and reviewed including hospital records   Lab Tests:  I Ordered, and personally interpreted labs.  The pertinent results include: No leukocytosis.  No evidence of anemia.  Platelets within range.  Urine pregnancy negative.  UA without abnormality.  No electrolyte abnormalities.  No transaminitis.  No renal dysfunction.  Lipase within normal limits.   Imaging Studies ordered:  I ordered imaging studies including pelvic ultrasound I independently visualized and interpreted imaging which showed multiple uterine fibroids.  No other acute abnormality. I agree with the radiologist interpretation   Cardiac Monitoring: / EKG:  The patient was maintained on a cardiac monitor.  I personally viewed and interpreted the cardiac monitored which showed an underlying rhythm of: Sinus rhythm   Consultations Obtained:  N/a   Problem List / ED Course / Critical interventions / Medication management  Pelvic pain, vaginal bleeding I ordered medication including Toradol    Reevaluation of the patient after these medicines showed that the patient improved I have reviewed the patients home medicines and have made adjustments as needed   Social Determinants of Health:  Former cigar use.  Denies illicit drug use   Test / Admission - Considered:  Pelvic pain, vaginal bleeding Vitals signs within normal range and stable throughout visit. Laboratory/imaging studies significant for: See above  33 year old female  presents emergency department complaints of lower abdominal pain, vaginal bleeding.  States that she has been having episodes for the past couple of weeks after having sexual intercourse with her fianc.  States this happened 3 times.  Reports that after intercourse, develops lower abdominal pain as well as heavy menstrual bleeding.  States that she has noticed large clots.  States that this last for an hour or 2 before the bleeding improves but still deals with some persistent mild pelvic pain.  States that she had some pain this morning prompting visit to the emergency department.  States that she does have history of fibroids/ectopic pregnancy and states that pain feels similar but not nearly as bad as when she had an ectopic pregnancy.  Denies any fevers, chills, nausea vomiting, urinary symptoms, change in bowel habits. On exam, suprapubic/midline pelvic tenderness to palpation.  Labs unremarkable for any acute emergent process.  Pelvic ultrasound concerning for increase in number of fibroids.  Suspect that this is most likely cause of patient's symptoms.  Will recommend use of NSAIDs for treatment of pain, intermittent vaginal bleeding and recommend follow-up with OB/GYN.  Treatment plan discussed with patient and she is understanding was agreeable.  Patient well-appearing, afebrile in no acute distress. Worrisome signs and symptoms were discussed with the patient, and the patient acknowledged understanding to return to the ED if noticed. Patient was stable upon discharge.       Final diagnoses:  None    ED Discharge Orders     None          Silver Wonda LABOR, [place] 10/08/23 DANIAL    Lenor Hollering, MD 10/08/23 [phone]

## 2023-10-08 NOTE — Discharge Instructions (Signed)
 As discussed, your labs were normal including your red blood cells or hemoglobin.  Ultrasound showed new fibroids.  I suspect that this is most likely causing the increased pelvic pain/vaginal bleeding.  You may take anti-inflammatory such as Aleve , ibuprofen  which help with the pain as well as some of the bleeding.  Recommend follow-up with your OB/GYN for reassessment.

## 2023-10-14 DIAGNOSIS — Z79899 Other long term (current) drug therapy: Secondary | ICD-10-CM | POA: Diagnosis not present

## 2023-10-21 DIAGNOSIS — Z79899 Other long term (current) drug therapy: Secondary | ICD-10-CM | POA: Diagnosis not present

## 2023-10-25 ENCOUNTER — Ambulatory Visit: Admitting: Obstetrics and Gynecology

## 2023-11-11 DIAGNOSIS — Z79899 Other long term (current) drug therapy: Secondary | ICD-10-CM | POA: Diagnosis not present

## 2023-11-11 DIAGNOSIS — R5383 Other fatigue: Secondary | ICD-10-CM | POA: Diagnosis not present

## 2023-11-11 DIAGNOSIS — E559 Vitamin D deficiency, unspecified: Secondary | ICD-10-CM | POA: Diagnosis not present

## 2023-11-17 ENCOUNTER — Ambulatory Visit: Admitting: Internal Medicine

## 2023-12-13 DIAGNOSIS — Z1159 Encounter for screening for other viral diseases: Secondary | ICD-10-CM | POA: Diagnosis not present

## 2023-12-13 DIAGNOSIS — Z0000 Encounter for general adult medical examination without abnormal findings: Secondary | ICD-10-CM | POA: Diagnosis not present

## 2023-12-13 DIAGNOSIS — E7800 Pure hypercholesterolemia, unspecified: Secondary | ICD-10-CM | POA: Diagnosis not present

## 2023-12-13 DIAGNOSIS — Z79899 Other long term (current) drug therapy: Secondary | ICD-10-CM | POA: Diagnosis not present

## 2023-12-15 DIAGNOSIS — Z79899 Other long term (current) drug therapy: Secondary | ICD-10-CM | POA: Diagnosis not present

## 2024-01-11 DIAGNOSIS — Z1159 Encounter for screening for other viral diseases: Secondary | ICD-10-CM | POA: Diagnosis not present

## 2024-01-31 ENCOUNTER — Ambulatory Visit
Admission: EM | Admit: 2024-01-31 | Discharge: 2024-01-31 | Disposition: A | Discharge status: Home / Self Care | Attending: Student | Admitting: Student

## 2024-01-31 ENCOUNTER — Encounter: Payer: Self-pay | Admitting: *Deleted

## 2024-01-31 DIAGNOSIS — R6889 Other general symptoms and signs: Secondary | ICD-10-CM

## 2024-01-31 LAB — POC COVID19/FLU A&B COMBO
Covid Antigen, POC: NEGATIVE
Influenza A Antigen, POC: NEGATIVE
Influenza B Antigen, POC: NEGATIVE

## 2024-01-31 MED ORDER — ONDANSETRON 8 MG PO TBDP: 8.0000 mg | ORAL_TABLET | Freq: Three times a day (TID) | ORAL | 0 refills | Status: AC | PRN

## 2024-01-31 MED ORDER — PROMETHAZINE-DM 6.25-15 MG/5ML PO SYRP: 5.0000 mL | ORAL_SOLUTION | Freq: Four times a day (QID) | ORAL | 0 refills | Status: AC | PRN

## 2024-01-31 MED ORDER — OSELTAMIVIR PHOSPHATE 75 MG PO CAPS: 75.0000 mg | ORAL_CAPSULE | Freq: Two times a day (BID) | ORAL | 0 refills | Status: AC

## 2024-01-31 MED ORDER — IBUPROFEN 800 MG PO TABS
800.0000 mg | ORAL_TABLET | Freq: Once | ORAL | Status: AC
  Administered 2024-01-31: 800 mg via ORAL

## 2024-01-31 MED ORDER — ALBUTEROL SULFATE HFA 108 (90 BASE) MCG/ACT IN AERS: 1.0000 | INHALATION_SPRAY | Freq: Four times a day (QID) | RESPIRATORY_TRACT | 0 refills | Status: AC | PRN

## 2024-01-31 NOTE — ED Provider Notes (Signed)
 " [facility]    CSN: [id number] Arrival date & time: 01/31/24  1023      History   Chief Complaint Chief Complaint  Patient presents with   Generalized Body Aches    HPI Annette Conway is a 33 y.o. female presenting w flu-like illness.   Cough, chest tight, headache, body aches, runny nose, nausea, and diarrhea since yesterday. Nausea without vomiting. Cough is nonproductive, denies SOB. Chest wall discomfort with coughing.   Tylenol  and zofran  taken around 8am  The patient denies a history of pulmonary disease      HPI  Past Medical History:  Diagnosis Date   Headache(784.0)    UTI (urinary tract infection)    Vaginal Pap smear, abnormal     Patient Active Problem List   Diagnosis Date Noted   Fibroid uterus 06/11/2022   Dysmenorrhea 06/11/2022   History of C-section 06/23/2013    Past Surgical History:  Procedure Laterality Date   CESAREAN SECTION      OB History     Gravida  3   Para  2   Term  2   Preterm      AB  1   Living  2      SAB      IAB      Ectopic  1   Multiple      Live Births  2            Home Medications    Prior to Admission medications  Medication Sig Start Date End Date Taking? Authorizing Provider  albuterol  (VENTOLIN  HFA) 108 (90 Base) MCG/ACT inhaler Inhale 1-2 puffs into the lungs every 6 (six) hours as needed for wheezing or shortness of breath. 01/31/24  Yes Kilo Eshelman E, PA-C  ondansetron  (ZOFRAN ) 4 MG tablet Take 1 tablet (4 mg total) by mouth every 6 (six) hours. 04/18/23  Yes Horton, Roxie HERO, DO  promethazine -dextromethorphan (PROMETHAZINE -DM) 6.25-15 MG/5ML syrup Take 5 mLs by mouth 4 (four) times daily as needed for cough. 01/31/24  Yes Marcelus Dubberly E, PA-C  ondansetron  (ZOFRAN -ODT) 8 MG disintegrating tablet Take 1 tablet (8 mg total) by mouth every 8 (eight) hours as needed for nausea or vomiting. 01/31/24   Hayde Kilgour E, PA-C  oseltamivir  (TAMIFLU ) 75 MG capsule Take 1  capsule (75 mg total) by mouth every 12 (twelve) hours. 01/31/24   Arlyss Leita BRAVO, PA-C    Family History Family History  Problem Relation Age of Onset   Diabetes Mother    Diabetes Maternal Grandmother    Diabetes Paternal Grandmother    Anesthesia problems Neg Hx    Hypotension Neg Hx    Malignant hyperthermia Neg Hx    Pseudochol deficiency Neg Hx     Social History Social History[1]   Allergies   Patient has no known allergies.   Review of Systems Review of Systems  Constitutional:  Positive for chills. Negative for appetite change and fever.  HENT:  Positive for congestion. Negative for ear pain, rhinorrhea, sinus pressure, sinus pain and sore throat.   Eyes:  Negative for redness and visual disturbance.  Respiratory:  Positive for cough. Negative for chest tightness, shortness of breath and wheezing.   Cardiovascular:  Negative for chest pain and palpitations.  Gastrointestinal:  Negative for abdominal pain, constipation, diarrhea, nausea and vomiting.  Genitourinary:  Negative for dysuria, frequency and urgency.  Musculoskeletal:  Positive for myalgias.  Neurological:  Negative for dizziness, weakness and headaches.  Psychiatric/Behavioral:  Negative for confusion.   All other systems reviewed and are negative.    Physical Exam Triage Vital Signs ED Triage Vitals  Encounter Vitals Group     BP 01/31/24 1043 119/79     Girls Systolic BP Percentile --      Girls Diastolic BP Percentile --      Boys Systolic BP Percentile --      Boys Diastolic BP Percentile --      Pulse Rate 01/31/24 1043 75     Resp 01/31/24 1043 20     Temp 01/31/24 1043 98.6 F (37 C)     Temp Source 01/31/24 1043 Oral     SpO2 01/31/24 1043 97 %     Weight --      Height --      Head Circumference --      Peak Flow --      Pain Score 01/31/24 1040 8     Pain Loc --      Pain Education --      Exclude from Growth Chart --    No data found.  Updated Vital Signs BP 119/79 (BP  Location: Left Arm)   Pulse 75   Temp 98.6 F (37 C) (Oral)   Resp 20   LMP 01/17/2024 (Approximate)   SpO2 97%   Breastfeeding No   Visual Acuity Right Eye Distance:   Left Eye Distance:   Bilateral Distance:    Right Eye Near:   Left Eye Near:    Bilateral Near:     Physical Exam Vitals reviewed.  Constitutional:      General: She is not in acute distress.    Appearance: Normal appearance. She is ill-appearing.  HENT:     Head: Normocephalic and atraumatic.     Right Ear: Tympanic membrane, ear canal and external ear normal. No tenderness. No middle ear effusion. There is no impacted cerumen. Tympanic membrane is not perforated, erythematous, retracted or bulging.     Left Ear: Tympanic membrane, ear canal and external ear normal. No tenderness.  No middle ear effusion. There is no impacted cerumen. Tympanic membrane is not perforated, erythematous, retracted or bulging.     Nose: Nose normal. No congestion.     Mouth/Throat:     Mouth: Mucous membranes are moist.     Pharynx: Uvula midline. No oropharyngeal exudate or posterior oropharyngeal erythema.     Tonsils: No tonsillar exudate.  Eyes:     Extraocular Movements: Extraocular movements intact.     Pupils: Pupils are equal, round, and reactive to light.  Cardiovascular:     Rate and Rhythm: Normal rate and regular rhythm.     Heart sounds: Normal heart sounds.  Pulmonary:     Effort: Pulmonary effort is normal.     Breath sounds: Normal breath sounds. No decreased breath sounds, wheezing, rhonchi or rales.  Abdominal:     Palpations: Abdomen is soft.     Tenderness: There is no abdominal tenderness. There is no guarding or rebound.  Lymphadenopathy:     Cervical: No cervical adenopathy.     Right cervical: No superficial, deep or posterior cervical adenopathy.    Left cervical: No superficial, deep or posterior cervical adenopathy.  Skin:    Comments: No rash   Neurological:     General: No focal deficit  present.     Mental Status: She is alert and oriented to person, place, and time.  Psychiatric:  Mood and Affect: Mood normal.        Behavior: Behavior normal.        Thought Content: Thought content normal.        Judgment: Judgment normal.      UC Treatments / Results  Labs (all labs ordered are listed, but only abnormal results are displayed) Labs Reviewed  POC COVID19/FLU A&B COMBO - Normal    EKG   Radiology No results found.  Procedures Procedures (including critical care time)  Medications Ordered in UC Medications  ibuprofen  (ADVIL ) tablet 800 mg (800 mg Oral Given 01/31/24 1102)    Initial Impression / Assessment and Plan / UC Course  I have reviewed the triage vital signs and the nursing notes.  Pertinent labs & imaging results that were available during my care of the patient were reviewed by me and considered in my medical decision making (see chart for details).     Patient is a pleasant 33 y.o. female presenting with flu-like illness. The patient is afebrile and nontachycardic.  Last home antipyretic was 3 hours prior to visit. LMP 01/17/24, she is not pregnant or breastfeeding.   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  Following discussion, Tamiflu  sent.  Zofran  sent to have on hand.  Promethazine  DM sent for symptomatic relief.  Albuterol  sent to have on hand.  Return precautions as below.  Final Clinical Impressions(s) / UC Diagnoses   Final diagnoses:  Flu-like symptoms     Discharge Instructions      -Your covid and influenza tests were negative, but based on your symptoms, I still think you have influenza (the flu) -We are treating this with a medication called Tamiflu , which is an antiviral.  This will help reduce the severity and duration of the fluid. -Take the Zofran  (ondansetron ) up to 3 times  daily if you develop nausea and vomiting. Dissolve one pill under your tongue or between your teeth and your cheek -Albuterol  inhaler as needed for cough, wheezing, shortness of breath, 1 to 2 puffs every 6 hours as needed. -Promethazine  DM cough syrup for congestion/cough. This could make you drowsy, so take at night before bed. -Use tylenol  and/or ibuprofen  for fever reduction or bodyaches, if needed.  Follow the instructions on the bottle for appropriate dosage.  You can take both Tylenol  and ibuprofen ; you can take them at the same time, or alternate every few hours (for example, Tylenol , then 4 hours later ibuprofen , then 4 hours later Tylenol , etc.) -Viruses like the flu typically last 5 to 7 days.  After 7 days, your symptoms should be improving rather than worsening.  If your symptoms improve, and then worsen again, this is when we worry about a sinus infection or a lung infection, and you should return for additional care.  If at any point you develop fevers that do not reduce with Tylenol , shortness of breath, a cough productive of dark or red sputum, or other symptoms that concern you, seek additional care.     ED Prescriptions     Medication Sig Dispense Auth. Provider   ondansetron  (ZOFRAN -ODT) 8 MG disintegrating tablet Take 1 tablet (8 mg total) by mouth every 8 (eight) hours as needed for nausea or vomiting. 20 tablet Coy Vandoren E, PA-C   oseltamivir  (TAMIFLU ) 75 MG capsule Take 1 capsule (75 mg total) by mouth every 12 (twelve) hours. 10 capsule Arlyss,  Sharunda Salmon E, PA-C   promethazine -dextromethorphan (PROMETHAZINE -DM) 6.25-15 MG/5ML syrup Take 5 mLs by mouth 4 (four) times daily as needed for cough. 118 mL Yevonne Yokum E, PA-C   albuterol  (VENTOLIN  HFA) 108 (90 Base) MCG/ACT inhaler Inhale 1-2 puffs into the lungs every 6 (six) hours as needed for wheezing or shortness of breath. 1 each Arlyss Leita BRAVO, PA-C      PDMP not reviewed this encounter.     [1]  Social  History Tobacco Use   Smoking status: Former    Types: Cigars   Smokeless tobacco: Never  Vaping Use   Vaping status: Never Used  Substance Use Topics   Alcohol use: Yes    Comment: occ   Drug use: Yes    Types: Marijuana    Comment: Daril Arlyss Leita BRAVO, PA-C 01/31/24 1134  "

## 2024-01-31 NOTE — ED Triage Notes (Signed)
 Cough, chest tight, headache, body aches, runny nose, nausea, and diarrhea since yesterday. Tylenol  and zofran  taken around 8am

## 2024-01-31 NOTE — Discharge Instructions (Signed)
-  Your covid and influenza tests were negative, but based on your symptoms, I still think you have influenza (the flu) -We are treating this with a medication called Tamiflu , which is an antiviral.  This will help reduce the severity and duration of the fluid. -Take the Zofran  (ondansetron ) up to 3 times daily if you develop nausea and vomiting. Dissolve one pill under your tongue or between your teeth and your cheek -Albuterol  inhaler as needed for cough, wheezing, shortness of breath, 1 to 2 puffs every 6 hours as needed. -Promethazine  DM cough syrup for congestion/cough. This could make you drowsy, so take at night before bed. -Use tylenol  and/or ibuprofen  for fever reduction or bodyaches, if needed.  Follow the instructions on the bottle for appropriate dosage.  You can take both Tylenol  and ibuprofen ; you can take them at the same time, or alternate every few hours (for example, Tylenol , then 4 hours later ibuprofen , then 4 hours later Tylenol , etc.) -Viruses like the flu typically last 5 to 7 days.  After 7 days, your symptoms should be improving rather than worsening.  If your symptoms improve, and then worsen again, this is when we worry about a sinus infection or a lung infection, and you should return for additional care.  If at any point you develop fevers that do not reduce with Tylenol , shortness of breath, a cough productive of dark or red sputum, or other symptoms that concern you, seek additional care.
